# Patient Record
Sex: Male | Born: 1950 | Race: Black or African American | Hispanic: No | Marital: Married | Smoking: Current every day smoker
Health system: Southern US, Community
[De-identification: ages and names within clinical notes are randomized; demographics above are authoritative.]

---

## 2005-01-22 ENCOUNTER — Emergency Department: Payer: Self-pay | Admitting: Emergency Medicine

## 2012-05-04 ENCOUNTER — Inpatient Hospital Stay: Payer: Self-pay | Admitting: Surgery

## 2012-05-04 LAB — COMPREHENSIVE METABOLIC PANEL
Anion Gap: 2 — ABNORMAL LOW (ref 7–16)
BUN: 23 mg/dL — ABNORMAL HIGH (ref 7–18)
Calcium, Total: 9.1 mg/dL (ref 8.5–10.1)
Co2: 31 mmol/L (ref 21–32)
EGFR (African American): >60
Glucose: 134 mg/dL — ABNORMAL HIGH (ref 65–99)
SGOT(AST): 31 U/L (ref 15–37)
SGPT (ALT): 25 U/L (ref 12–78)
Total Protein: 7.8 g/dL (ref 6.4–8.2)

## 2012-05-04 LAB — URINALYSIS, COMPLETE
Bilirubin,UR: NEGATIVE
Ketone: NEGATIVE
RBC,UR: 2 /HPF (ref 0–5)
WBC UR: <1 /HPF (ref 0–5)

## 2012-05-04 LAB — CBC
HCT: 43.3 % (ref 40.0–52.0)
MCH: 27.4 pg (ref 26.0–34.0)
MCV: 84 fL (ref 80–100)
Platelet: 259 10*3/uL (ref 150–440)
RDW: 13.3 % (ref 11.5–14.5)

## 2012-05-04 LAB — TROPONIN I: Troponin-I: <0.02 ng/mL

## 2012-05-04 LAB — LIPASE, BLOOD: Lipase: 128 U/L (ref 73–393)

## 2012-05-04 LAB — CK TOTAL AND CKMB (NOT AT ARMC)
CK, Total: 350 U/L — ABNORMAL HIGH (ref 35–232)
CK-MB: 4 ng/mL — ABNORMAL HIGH (ref 0.5–3.6)

## 2012-05-08 LAB — PATHOLOGY REPORT

## 2014-05-09 NOTE — Discharge Summary (Signed)
PATIENT NAME:  Nicholas Werner, Nicholas Werner MR#:  [id number] DATE OF BIRTH:  08/08/1950  DATE OF ADMISSION:  05/04/2012  DATE OF DISCHARGE:  05/05/2012  FINAL DIAGNOSES:  1.  Acute calculus cholecystitis.  2.  Hypertension.  3.  Tobacco abuse and dependence.   HOSPITAL COURSE SUMMARY:  The patient was admitted with abdominal pain, taken to the operating room on the 18th of April, at which point a laparoscopic cholecystectomy with cholangiography was performed. Normal cholangiogram was found. The patient had stones lodged within the gallbladder neck and an acute on chronic cholecystitis-appearing picture. Postoperatively, he did well. Jackson-Pratt drain was left in place. This remained nonbilious. The patient on postoperative day #1 was tolerating a regular diet. No bile was seen in the drain. Hemodynamically stable with adequate pain control and was discharged home in stable condition on postoperative day #1.   DISCHARGE MEDICATIONS:  Hydrochlorothiazide 12.5 mg by mouth once a day, amlodipine 10 mg by mouth once a day, lisinopril 40 mg by mouth once a day, omeprazole 20 mg by mouth delayed release capsule once a day, aspirin 81 mg by mouth once a day, acetaminophen/hydrocodone 5/325, 1 tab every 6 hours as needed for pain.   DISCHARGE FOLLOWUP:  Follow up April 24th with Dr. Egbert GaribaldiBird in the [place] office location. The patient may shower and remove dressings in 48 hours. Call with any questions or concerns of abdominal pain, fever or jaundice.   ____________________________ Redge GainerMark A. Egbert GaribaldiBird, MD mab:dmm D: 05/15/2012 22:14:55 ET T: 05/15/2012 23:05:44 ET JOB#: [id number]  cc: Loraine LericheMark A. Egbert GaribaldiBird, MD, <Dictator> Raynald KempMARK A Floyd Lusignan MD ELECTRONICALLY SIGNED 05/16/2012 7:56

## 2014-05-09 NOTE — H&P (Signed)
Subjective/Chief Complaint RUQ pain   History of Present Illness acute onset last night, ruq pain nausea, no emesis no f/c nml BM no jaundice has known about gallstones for years   Past History HTN, tob abuse PSH none   Past Medical Health Hypertension, Smoking   Past Med/Surgical Hx:  Hypertension:   ALLERGIES:  No Known Allergies:   HOME MEDICATIONS: Medication Instructions Status  hydrochlorothiazide 12.5 mg oral capsule 1 cap(s) orally once a day Active  amLODIPine 10 mg oral tablet 1 tab(s) orally once a day Active  lisinopril 40 mg oral tablet 1 tab(s) orally once a day Active  omeprazole 20 mg oral delayed release capsule 1 cap(s) orally once a day Active  aspirin 81 mg oral tablet 1 tab(s) orally once a day Active   Family and Social History:  Family History Non-Contributory   Social History positive  tobacco, positive  tobacco (Current within 1 year), negative ETOH   + Tobacco Current (within 1 year)   Place of Living Home   Review of Systems:  Fever/Chills No   Cough No   Abdominal Pain Yes   Diarrhea No   Constipation No   Nausea/Vomiting Yes   SOB/DOE No   Chest Pain No   Dysuria No   Tolerating Diet Nauseated   Physical Exam:  GEN no acute distress, uncomfortable   HEENT pink conjunctivae   NECK supple   RESP normal resp effort  no use of accessory muscles  rhonchi   CARD regular rate   ABD positive tenderness  palpable gallbladder ruq, pos Murphy's sign   LYMPH negative neck   EXTR negative edema   SKIN normal to palpation   PSYCH alert, A+O to time, place, person, good insight   Lab Results: Hepatic:  18-Apr-14 01:46   Bilirubin, Total 0.3  Alkaline Phosphatase 114  SGPT (ALT) 25  SGOT (AST) 31  Total Protein, Serum 7.8  Albumin, Serum 3.7  Routine Chem:  18-Apr-14 01:46   Glucose, Serum  134  BUN  23  Creatinine (comp) 1.16  Sodium, Serum 138  Potassium, Serum 4.0  Chloride, Serum 105  CO2, Serum 31   Calcium (Total), Serum 9.1  Osmolality (calc) 281  eGFR (African American) >60  eGFR (Non-African American) >60 (eGFR values <82m/min/1.73 m2 may be an indication of chronic kidney disease (CKD). Calculated eGFR is useful in patients with stable renal function. The eGFR calculation will not be reliable in acutely ill patients when serum creatinine is changing rapidly. It is not useful in  patients on dialysis. The eGFR calculation may not be applicable to patients at the low and high extremes of body sizes, pregnant women, and vegetarians.)  Anion Gap  2  Lipase 128 (Result(s) reported on 04 May 2012 at 02:07AM.)  Cardiac:  18-Apr-14 01:46   Troponin I < 0.02 (0.00-0.05 0.05 ng/mL or less: NEGATIVE  Repeat testing in 3-6 hrs  if clinically indicated. >0.05 ng/mL: POTENTIAL  MYOCARDIAL INJURY. Repeat  testing in 3-6 hrs if  clinically indicated. NOTE: An increase or decrease  of 30% or more on serial  testing suggests a  clinically important change)  CK, Total  350  CPK-MB, Serum  4.0 (Result(s) reported on 04 May 2012 at 03:45AM.)  Routine UA:  18-Apr-14 01:46   Color (UA) Straw  Clarity (UA) Clear  Glucose (UA) Negative  Bilirubin (UA) Negative  Ketones (UA) Negative  Specific Gravity (UA) 1.013  Blood (UA) Negative  pH (UA) 9.0  Protein (  UA) 30 mg/dL  Nitrite (UA) Negative  Leukocyte Esterase (UA) Negative (Result(s) reported on 04 May 2012 at 02:56AM.)  RBC (UA) 2 /HPF  WBC (UA) <1 /HPF  Bacteria (UA) NONE SEEN  Epithelial Cells (UA) <1 /HPF (Result(s) reported on 04 May 2012 at 02:56AM.)  Routine Hem:  18-Apr-14 01:46   WBC (CBC) 8.1  RBC (CBC) 5.15  Hemoglobin (CBC) 14.1  Hematocrit (CBC) 43.3  Platelet Count (CBC) 259 (Result(s) reported on 04 May 2012 at 02:04AM.)  MCV 84  MCH 27.4  MCHC 32.6  RDW 13.3    Assessment/Admission Diagnosis acute cholecystitis admit hydrate lap chole by Dr Marina Gravel, risks options rev'd agrees with plan   Electronic  Signatures: Florene Glen (MD)  (Signed 18-Apr-14 06:21)  Authored: CHIEF COMPLAINT and HISTORY, PAST MEDICAL/SURGIAL HISTORY, ALLERGIES, HOME MEDICATIONS, FAMILY AND SOCIAL HISTORY, REVIEW OF SYSTEMS, PHYSICAL EXAM, LABS, ASSESSMENT AND PLAN   Last Updated: 18-Apr-14 06:21 by Florene Glen (MD)

## 2014-05-09 NOTE — H&P (Signed)
PATIENT NAME:  Nicholas Werner, Nicholas Werner MR#:  [id number] DATE OF BIRTH:  11/10/1950  DATE OF ADMISSION:  05/04/2012  CHIEF COMPLAINT:  Right upper quadrant pain.   HISTORY OF PRESENT ILLNESS:  This is a 64 year old black male patient with a history of the acute onset of right upper quadrant pain.  It was following a fatty meal.  He is nauseated, but has not had any emesis.  He has had episodes like this before and has in fact known about his gallstones for years, but this is a recent event and he has not had this happen for a long time.  He denies melena or hematochezia.  No jaundice or acholic stools.  No fevers or chills.   PAST MEDICAL HISTORY:  Hypertension and tobacco abuse.   PAST SURGICAL HISTORY:  None.   ALLERGIES:  None.   MEDICATIONS:  Multiple, see chart and reconciliation.   FAMILY HISTORY:  Noncontributory.   SOCIAL HISTORY:  The patient smokes tobacco about 1/2 pack of cigarettes per day.  Works at [employer] in [employer].  Does not drink alcohol.   REVIEW OF SYSTEMS:   A 10 system review is performed and negative with the exception of that mentioned in the history of present illness.   PHYSICAL EXAMINATION: GENERAL:  Healthy-appearing black male patient, appears uncomfortable.  VITAL SIGNS:  Temp of 98, pulse of 84, respirations 16, blood pressure 182/91.  Pain scale of 10, 97% room air sat.  HEENT:  Shows no scleral icterus.  NECK:  No palpable neck nodes.  CHEST:  Clear to auscultation except some rhonchi especially on the right side.  CARDIAC:  Regular rate and rhythm.  ABDOMEN:  Soft.  There is tenderness in the right upper quadrant and the gallbladder is palpable as a mass in the right upper quadrant with a positive Murphy's sign.  EXTREMITIES:  Without edema.  NEUROLOGIC:  Grossly intact.  INTEGUMENT:  Shows no jaundice.   LABORATORY AND RADIOLOGICAL DATA:  An ultrasound report was relayed to me as a wet reading by Dr. Manson PasseyBrown suggesting acute cholecystitis.     Laboratory values demonstrate normal LFTs, normal white blood cell count, and an H and H of 14 and 43 with a platelet count of 259.   ASSESSMENT:  This is a patient with acute cholecystitis.   PLAN:  Is to admit the patient to the hospital, hydrate, start antibiotics and plan laparoscopic cholecystectomy for relief of his symptoms later today or tomorrow by Dr. Egbert GaribaldiBird and at Dr. Molinda BailiffBird's discretion.    The procedure and the rationale were discussed with he and his wife.  The options of observation reviewed and the risks of bleeding, infection, failure to resolve all of his symptoms, conversion to an open procedure, bile duct damage, bile duct leak, bowel injury, any of which could require further surgery and/or ERCP, stent, or papillotomy were all reviewed with them.  They understood and agreed to proceed.     ____________________________ Adah Salvageichard Werner. Excell Seltzerooper, MD rec:ea D: 05/04/2012 06:24:27 ET T: 05/04/2012 07:06:48 ET JOB#: [id number]  cc: Adah Salvageichard Werner. Excell Seltzerooper, MD, <Dictator> Lattie HawICHARD Werner Rashana Andrew MD ELECTRONICALLY SIGNED 05/09/2012 13:03

## 2014-05-09 NOTE — Op Note (Signed)
PATIENT NAME:  Nicholas Werner, [name] MR#:  [id number] DATE OF BIRTH:  [birth year]  DATE OF PROCEDURE:  05/04/2012  PREOPERATIVE DIAGNOSIS:  Acute calculus cholecystitis.   POSTOPERATIVE DIAGNOSIS:  Acute calculus cholecystitis.  PROCEDURE PERFORMED:  Laparoscopic cholecystectomy with intraoperative cholangiography.   SURGEON:  Lajuan Kovaleski A. Egbert GaribaldiBird, MD   ASSISTANTS:  None.   ANESTHESIA:  General endotracheal.   FINDINGS: 1.  Stones impacted in gallbladder neck.  2.  Normal cholangiogram.  3.  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   SPECIMENS:  Gallbladder with contents to Pathology.   DRAINS:  Jackson-Pratt in Morison's pouch.   ESTIMATED BLOOD LOSS:  75 mL.   DESCRIPTION OF PROCEDURE:  Informed consent, supine position, general endotracheal anesthesia, the left arm padded and tucked at his side. The abdomen prepped and draped of hair and ChloraPrep. Time-out was observed. A 12-mm blunt Hassan trocar was placed through an open technique through an infraumbilical skin incision. Pneumoperitoneum was established. A 5-mm Bladeless trocar was placed in the epigastric region. Two 5-mm ports were then placed a right subcostal margin. The gallbladder was tensely distended consistent with acute cholecystitis. It was aspirated of approximately 50 mL of thick bile. This allowed placement of a grasper on the fundus. The gallbladder was elevated towards the right shoulder. A large amount of attachments that appeared to be chronic and acute were swept down inferiorly off the gallbladder body with blunt technique and application of point cautery at bleeding points. There appeared to be stones lodged within the gallbladder neck. These were able to be milked up back into the gallbladder such that lateral traction could be applied to Hartmann's pouch. The dissection of the hepatoduodenal ligament demonstrated an anteriorly located cystic artery which was triply clipped on the portal side, singly clipped on the gallbladder side and  divided. The pericystic lymph node was identified as well.   The cystic duct was thickened. It was involved in a chronic inflammatory rind which was taken down with blunt technique. The course of the common bile duct was identified. In the course of doing this, a small hole was made in the gallbladder. This was controlled with suction. Lateral and medial attachments of the gallbladder above this area on the gallbladder fossa were taken down with hook electrocautery such that a critical view of safety cholecystectomy was achieved. A Reddick catheter was brought onto the field, placed into the small defect and balloon occlusion of the cystic duct was achieved. Dynamic fluoroscopy was used to achieve cholangiography which appeared to be normal with proper identification of the cystic duct. There was filling of the duodenum. The bile duct appeared to be dilated but patent. No obvious filling defects were encountered. The bifurcation the right and left systems were identified. Due to the thickened nature of the duct and the hole ready in it, I elected to transect this with an endoscopic GIA stapler with blue load application which was achieved by exchanging the 5-mm trocar site to a 12 and transecting the cystic duct with the stapler. The gallbladder was then retrieved off the gallbladder fossa utilizing hook electrocautery, placed into an EndoCatch device and retrieved. The right upper quadrant was irrigated with several liters of normal saline during the case and aspirated dry. Hemostasis on the operative field appeared to be adequate. A 19-mm Blake drain was directed into this space and exited the lowermost right upper quadrant port site. The drain site was secured with silk suture. The ports were then removed under direct visualization. Hemostasis appeared to be adequate  on the operative field. The infraumbilical fascial defect was closed utilizing a total of 3 simple and figure-of-eight #0 Vicryl sutures in  vertical orientation. A total of 30 mL of 0.25% plain Marcaine was infiltrated along all skin and fascial incisions prior to closure. Benzoin, Steri-Strips, Telfa and Tegaderm were applied, and the patient was then subsequently extubated and taken to the recovery room in stable and satisfactory condition by anesthesia services.    ____________________________ Redge Gainer Egbert Garibaldi, MD mab:jm D: 05/04/2012 17:53:21 ET T: 05/05/2012 09:58:33 ET JOB#: [id number]  cc: Loraine Leriche A. Egbert Garibaldi, MD, <Dictator> Raynald Kemp MD ELECTRONICALLY SIGNED 05/11/2012 19:44

## 2014-08-25 IMAGING — US ABDOMEN ULTRASOUND LIMITED
1 series · 14 of 25 positions shown · non-contrast
Comparison: none

REASON FOR EXAM: RUQ pain and nausea
COMMENTS:   Body Site: GB and Fossa, CBD, Head of Pancreas

PROCEDURE:     US  - US ABDOMEN LIMITED SURVEY  - May 04, 2012  [DATE]
RESULT:     Comparison: None.
TECHNIQUE: Multiple grayscale and color Doppler images were obtained of the
right upper quadrant.

[Series 1: abdomen ultrasound limited · 0.31mm/px · 14 of 52 slices shown]
[im 1/52]
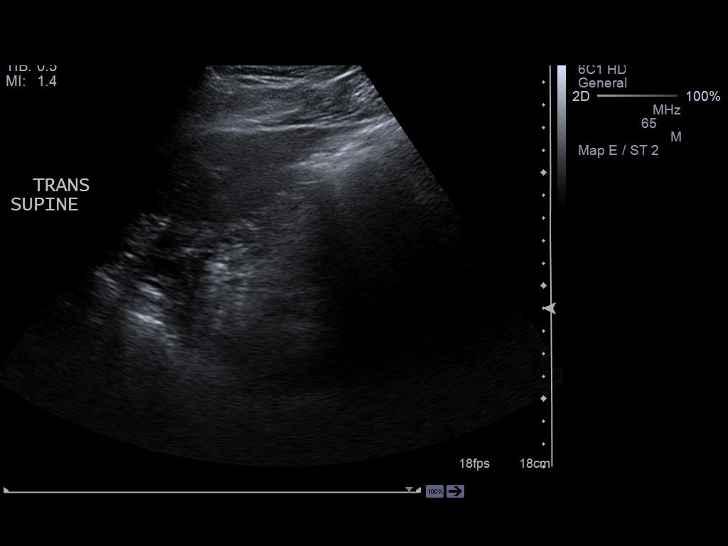
[im 5/52]
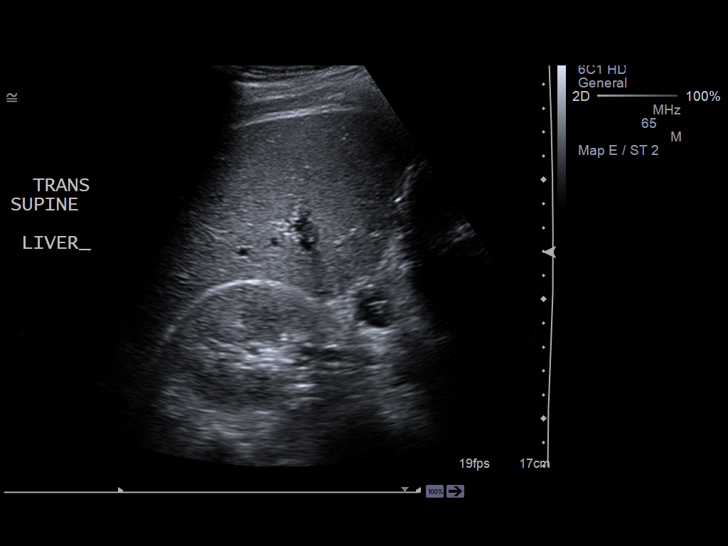
[im 9/52]
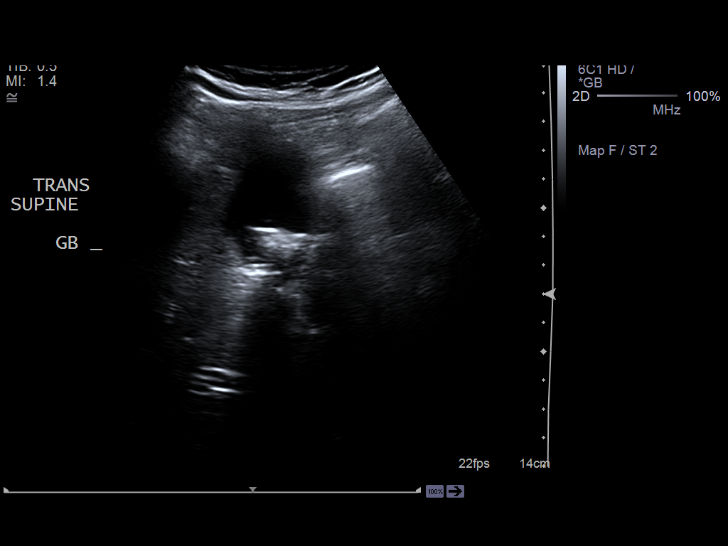
[im 13/52]
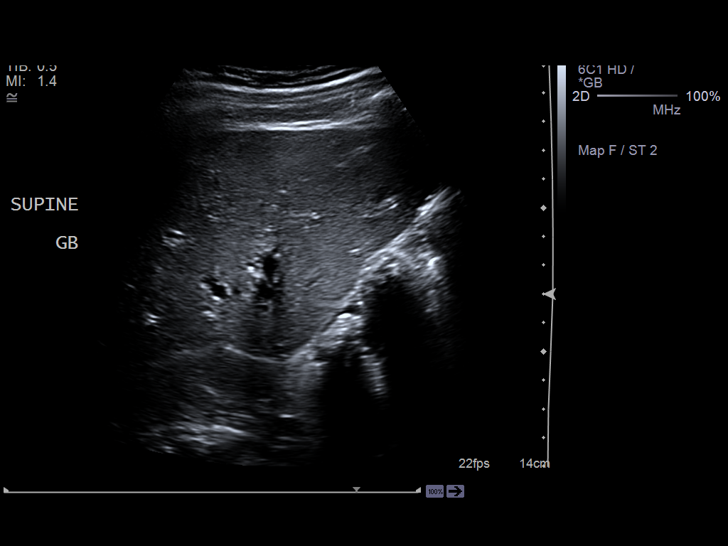
[im 18/52]
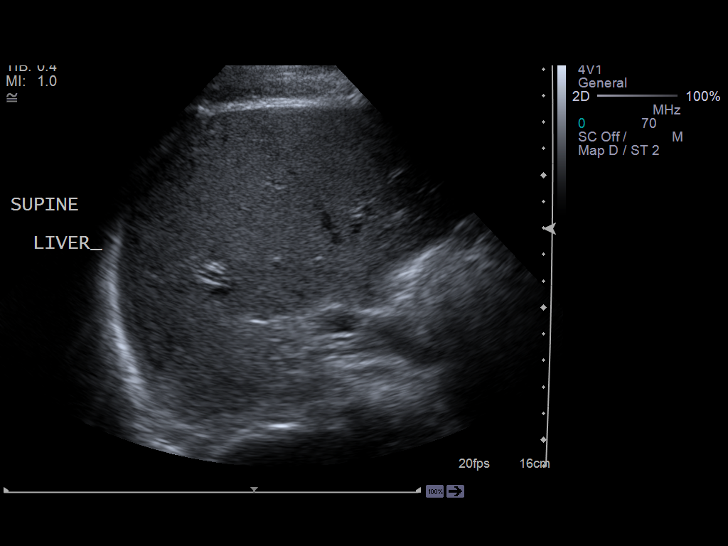
[im 20/52]
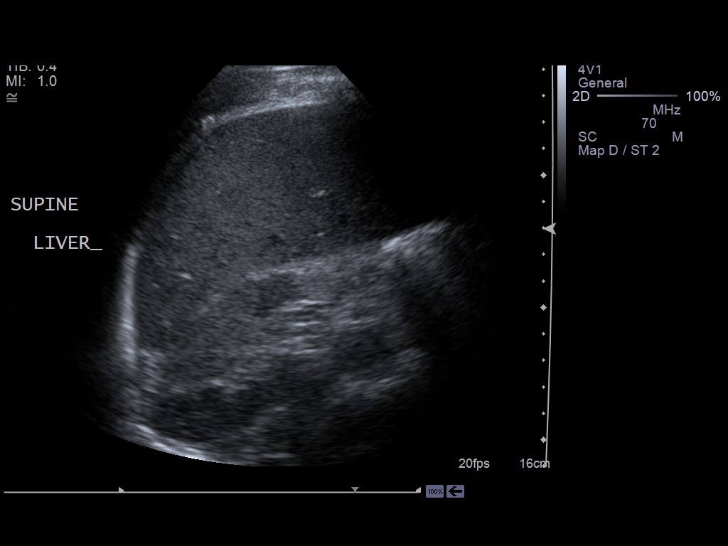
[im 24/52]
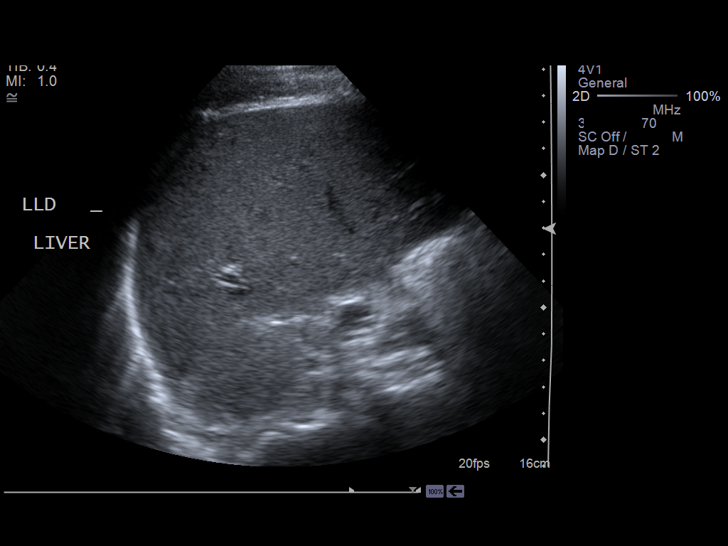
[im 28/52]
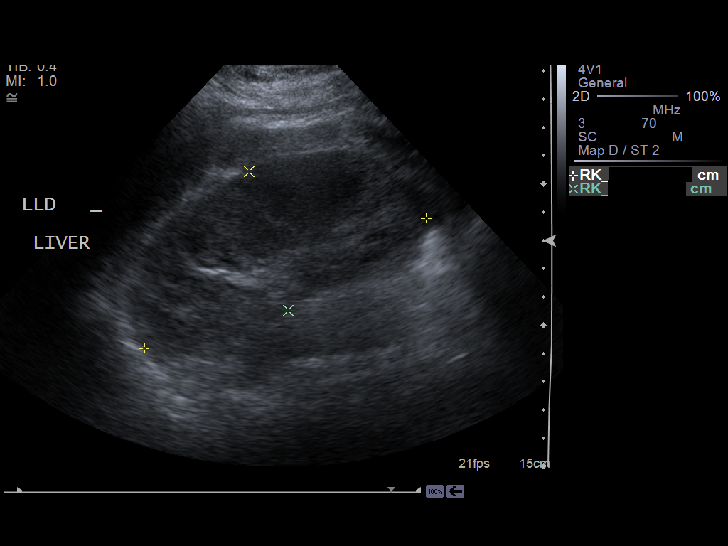
[im 32/52]
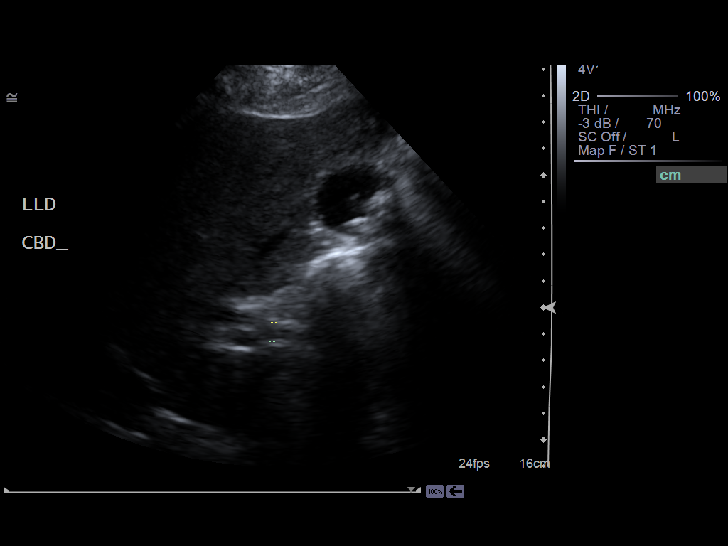
[im 35/52]
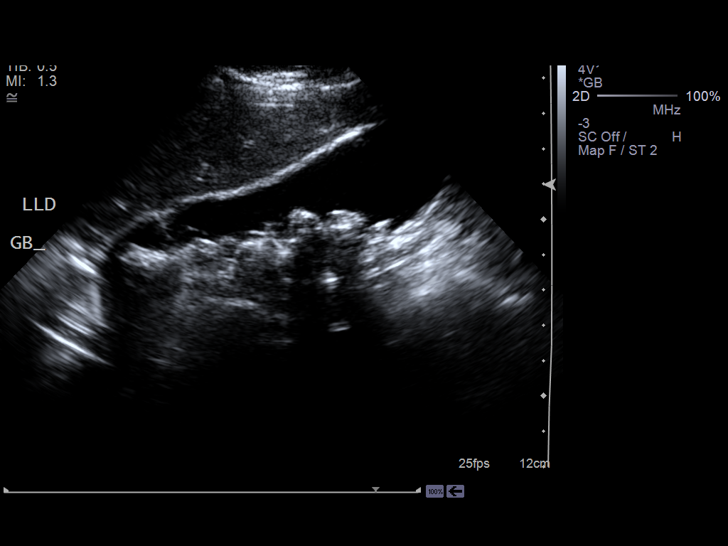
[im 39/52]
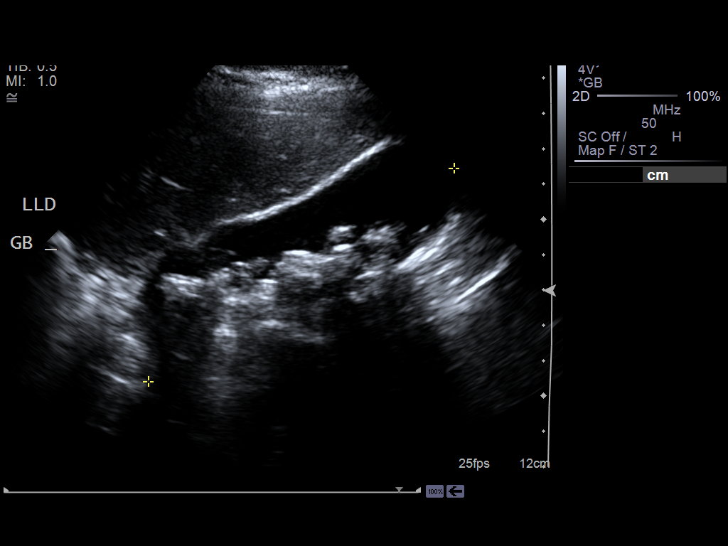
[im 43/52]
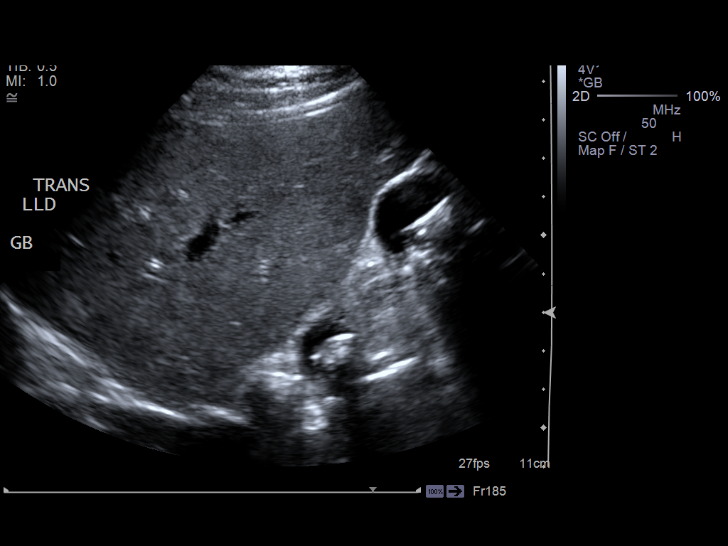
[im 47/52]
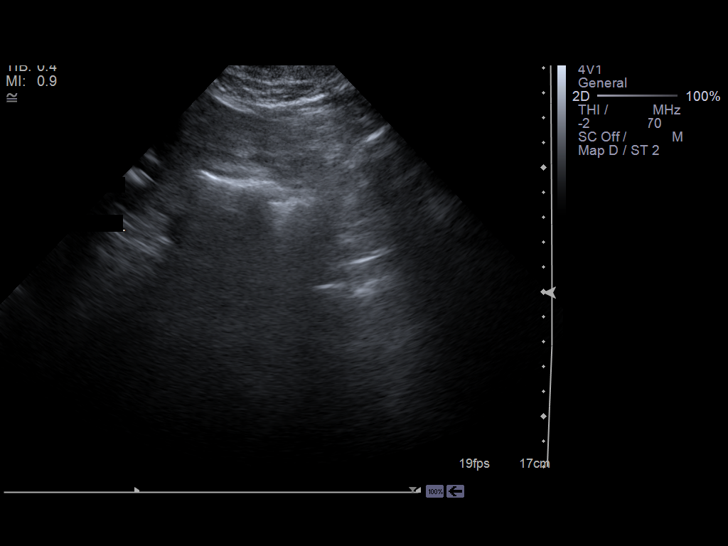
[im 52/52]
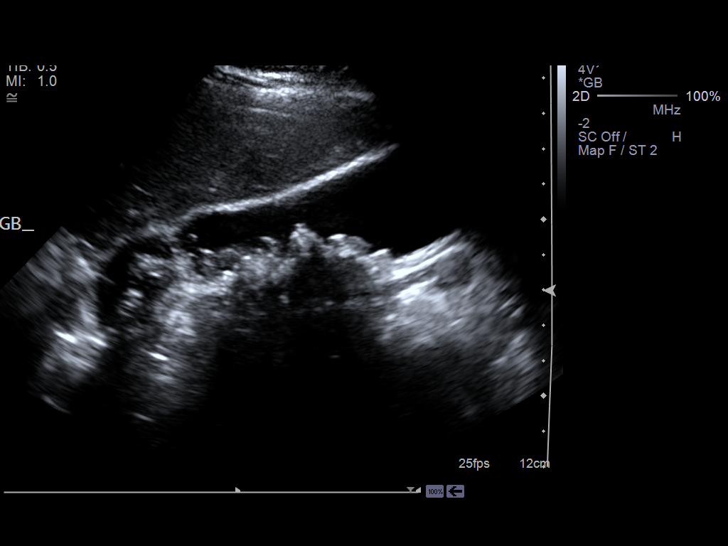

[14 of 25 positions shown; findings below may reference images not displayed]

FINDINGS: The pancreas was obscured by overlying bowel gas. The visualized liver is
unremarkable. The main portal vein is patent. The common bile duct is
borderline enlarged, measuring 7 mm in diameter. There are multiple stones
in the gallbladder. There is mild gallbladder wall thickening, measuring 3-4
mm in thickness. The sonographic Murphy sign was positive. By report from
the technologist, some of the stones in the gallbladder neck were not mobile.
IMPRESSION: 1. Cholelithiasis, with findings concerning for acute cholecystitis.
2. The common bile duct is borderline dilated. Etiology for this dilatation
is not identified on this study. Further evaluation could be provided with
ERCP or M.R.C.P., as indicated.

## 2018-01-29 ENCOUNTER — Ambulatory Visit: Payer: Self-pay | Admitting: Family Medicine

## 2018-08-17 ENCOUNTER — Other Ambulatory Visit: Payer: Self-pay

## 2019-01-15 DIAGNOSIS — U071 COVID-19: Secondary | ICD-10-CM | POA: Diagnosis not present

## 2019-01-25 ENCOUNTER — Ambulatory Visit: Payer: PPO | Attending: Internal Medicine

## 2019-01-25 DIAGNOSIS — Z20822 Contact with and (suspected) exposure to covid-19: Secondary | ICD-10-CM | POA: Diagnosis not present

## 2019-01-27 LAB — NOVEL CORONAVIRUS, NAA: SARS-CoV-2, NAA: NOT DETECTED

## 2019-05-11 DIAGNOSIS — I10 Essential (primary) hypertension: Secondary | ICD-10-CM | POA: Diagnosis not present

## 2019-05-11 DIAGNOSIS — Z1389 Encounter for screening for other disorder: Secondary | ICD-10-CM | POA: Diagnosis not present

## 2019-05-11 DIAGNOSIS — E119 Type 2 diabetes mellitus without complications: Secondary | ICD-10-CM | POA: Diagnosis not present

## 2019-05-11 DIAGNOSIS — Z125 Encounter for screening for malignant neoplasm of prostate: Secondary | ICD-10-CM | POA: Diagnosis not present

## 2019-05-11 DIAGNOSIS — F17200 Nicotine dependence, unspecified, uncomplicated: Secondary | ICD-10-CM | POA: Diagnosis not present

## 2019-05-14 DIAGNOSIS — F17200 Nicotine dependence, unspecified, uncomplicated: Secondary | ICD-10-CM | POA: Diagnosis not present

## 2019-05-14 DIAGNOSIS — Z125 Encounter for screening for malignant neoplasm of prostate: Secondary | ICD-10-CM | POA: Diagnosis not present

## 2019-05-14 DIAGNOSIS — I10 Essential (primary) hypertension: Secondary | ICD-10-CM | POA: Diagnosis not present

## 2019-05-14 DIAGNOSIS — Z1389 Encounter for screening for other disorder: Secondary | ICD-10-CM | POA: Diagnosis not present

## 2019-05-14 DIAGNOSIS — E119 Type 2 diabetes mellitus without complications: Secondary | ICD-10-CM | POA: Diagnosis not present

## 2019-05-22 ENCOUNTER — Telehealth: Payer: Self-pay | Admitting: *Deleted

## 2019-05-22 NOTE — Telephone Encounter (Signed)
Received referral for lung screening scan. Contacted patient and obtained smoking history of 1/2 pack per day x 50 years. Notified patient that Medicare is not covering for new recommendations by USPSTF yet but that he can have screening scan as self pay if desired. Patient would like to wait until scan is covered by insurance.

## 2019-07-03 ENCOUNTER — Telehealth: Payer: Self-pay | Admitting: General Practice

## 2019-07-03 NOTE — Telephone Encounter (Signed)
Patient appears on [facility], but has never been seen here.  I left a message asking him to call and update the information.

## 2019-07-08 ENCOUNTER — Telehealth: Payer: Self-pay | Admitting: General Practice

## 2019-07-08 NOTE — Telephone Encounter (Signed)
2nd attempt to reach patient to confirm PCP.  Left msg

## 2019-07-10 ENCOUNTER — Telehealth: Payer: Self-pay | Admitting: General Practice

## 2019-07-10 NOTE — Telephone Encounter (Signed)
3rd attempt. No answer.

## 2019-10-01 DIAGNOSIS — L720 Epidermal cyst: Secondary | ICD-10-CM | POA: Diagnosis not present

## 2021-07-09 ENCOUNTER — Telehealth: Payer: Self-pay

## 2021-07-09 NOTE — Telephone Encounter (Signed)
Added to account.

## 2021-10-06 NOTE — Progress Notes (Signed)
New patient visit   Patient: Nicholas Werner   DOB: 10-12-1950   71 y.o. Male  MRN: [id number] Visit Date: 10/08/2021  Today's healthcare provider: Jacky Kindle, FNP  Patient presents for new patient visit to establish care.  Introduced to Publishing rights manager role and practice setting.  All questions answered.  Discussed provider/patient relationship and expectations.   I,Tiffany J Bragg,acting as a scribe for Jacky Kindle, FNP.,have documented all relevant documentation on the behalf of Jacky Kindle, FNP,as directed by  Jacky Kindle, FNP while in the presence of Jacky Kindle, FNP.   Chief Complaint  Patient presents with   Establish Care   Hypertension   Subjective    Nicholas Werner is a 71 y.o. male who presents today as a new patient to establish care.  HPI    History reviewed. No pertinent past medical history.  No family status information on file.   History reviewed. No pertinent family history. Social History   Socioeconomic History   Marital status: Married    Spouse name: Not on file   Number of children: Not on file   Years of education: Not on file   Highest education level: Not on file  Occupational History   Not on file  Tobacco Use   Smoking status: Every Day    Packs/day: 0.50    Types: Cigarettes    Start date: 01/17/1998   Smokeless tobacco: Never  Substance and Sexual Activity   Alcohol use: Not Currently   Drug use: Not Currently   Sexual activity: Not on file  Other Topics Concern   Not on file  Social History Narrative   Not on file   Social Determinants of Health   Financial Resource Strain: Not on file  Food Insecurity: Not on file  Transportation Needs: Not on file  Physical Activity: Not on file  Stress: Not on file  Social Connections: Not on file   Outpatient Medications Prior to Visit  Medication Sig   amLODipine (NORVASC) 10 MG tablet Take 10 mg by mouth daily.   hydrochlorothiazide (HYDRODIURIL) 25 MG tablet Take 25  mg by mouth daily.   lisinopril (ZESTRIL) 40 MG tablet Take 40 mg by mouth daily.   omeprazole (PRILOSEC) 20 MG capsule Take 20 mg by mouth daily.   No facility-administered medications prior to visit.   Not on File   There is no immunization history on file for this patient.  Health Maintenance  Topic Date Due   Hepatitis C Screening  Never done   COLONOSCOPY (Pts 45-20yrs Insurance coverage will need to be confirmed)  Never done   COVID-19 Vaccine (1) 10/24/2021 (Originally 05/23/1951)   Zoster Vaccines- Shingrix (1 of 2) 01/07/2022 (Originally 11/23/2001)   INFLUENZA VACCINE  04/17/2022 (Originally 08/17/2021)   Pneumonia Vaccine 71+ Years old (1 - PCV) 10/09/2022 (Originally 11/23/1956)   TETANUS/TDAP  09/29/2030   HPV VACCINES  Aged Out    Patient Care Team: Jacky Kindle, FNP as PCP - General (Family Medicine)  Review of Systems  HENT:  Positive for dental problem.   Respiratory:  Positive for cough.   Genitourinary:  Positive for frequency.  Musculoskeletal:  Positive for myalgias.  Neurological:  Positive for dizziness.    Objective    BP 125/78 (BP Location: Right Arm, Patient Position: Sitting, Cuff Size: Normal)   Pulse 87   Resp 16   Ht 5\' 10"  (1.778 m)   Wt 175 lb (79.4 kg)  SpO2 100%   BMI 25.11 kg/m   Physical Exam Vitals and nursing note reviewed.  Constitutional:      Appearance: Normal appearance. He is overweight.  HENT:     Head: Normocephalic and atraumatic.  Eyes:     Pupils: Pupils are equal, round, and reactive to light.  Cardiovascular:     Rate and Rhythm: Normal rate and regular rhythm.     Pulses: Normal pulses.     Heart sounds: Normal heart sounds.  Pulmonary:     Effort: Pulmonary effort is normal.     Breath sounds: Normal breath sounds.  Musculoskeletal:        General: Normal range of motion.     Cervical back: Normal range of motion.  Skin:    General: Skin is warm and dry.     Capillary Refill: Capillary refill takes less  than 2 seconds.  Neurological:     General: No focal deficit present.     Mental Status: He is alert and oriented to person, place, and time. Mental status is at baseline.  Psychiatric:        Mood and Affect: Mood normal.        Behavior: Behavior normal.        Thought Content: Thought content normal.        Judgment: Judgment normal.     Depression Screen    10/08/2021    2:12 PM 10/08/2021    2:04 PM  PHQ 2/9 Scores  PHQ - 2 Score 0 0  PHQ- 9 Score 1 1   No results found for any visits on 10/08/21.  Assessment & Plan      Problem List Items Addressed This Visit       Cardiovascular and Mediastinum   Primary hypertension - Primary    Chronic, stable; at goal <130/<80 [facility] Controlled on norvasc 10 mg, HCTZ 25 mg in addition to lisinopril 40 mg       Relevant Medications   lisinopril (ZESTRIL) 40 MG tablet   amLODipine (NORVASC) 10 MG tablet   hydrochlorothiazide (HYDRODIURIL) 25 MG tablet   Other Relevant Orders   Comprehensive metabolic panel   CBC with Differential/Platelet   Lipid panel     Digestive   Gastroesophageal reflux disease without esophagitis    Chronic, stable Currently maintained on prilosec 20 mg      Relevant Medications   omeprazole (PRILOSEC) 20 MG capsule     Other   Elevated cholesterol    Chronic; controlled with diet and exercise Currently smokes Check LP recommend diet low in saturated fat and regular exercise - 30 min at least 5 times per week       Relevant Medications   lisinopril (ZESTRIL) 40 MG tablet   amLODipine (NORVASC) 10 MG tablet   hydrochlorothiazide (HYDRODIURIL) 25 MG tablet   Other Relevant Orders   Lipid panel   Encounter for hepatitis C screening test for low risk patient    Low risk screen Treatable, and curable. If left untreated Hep C can lead to cirrhosis and liver failure. Encourage routine testing; recommend repeat testing if risk factors change.       Relevant Orders    Hepatitis C Antibody   History of diabetes mellitus, type II    Seen in labs >10 years ago; repeat A1c Continue to recommend balanced, lower carb meals. Smaller meal size, adding snacks. Choosing water as drink of choice and increasing purposeful exercise. Not on any Rx  medication- controlled with diet/exercise       Relevant Orders   Hemoglobin A1c   Screening for colon cancer    Reports remote hx of colon polyps; due for repeat colon cancer screening       Relevant Orders   Ambulatory referral to Gastroenterology   Screening for prostate cancer    Denies LUTS; defer DRE- recommend PSA for screening       Relevant Orders   PSA   Screening for thyroid disorder    Recommend Screening thyroid to establish baseline       Relevant Orders   TSH + free T4   Tobacco use disorder, moderate, dependence    Chronic, stable Reports <10 cigs/day; has been smoking for 20 years 3-5 minute discuss regarding risks of tobacco/nicotine use and recommendations on ways to reduce use and work towards cessation of use of tobacco/nicotine products. Encouraged to use 1-800-QUIT-NOW. Does not wish to quit at this time         Return in about 6 months (around 04/08/2022) for chonic disease management.     Leilani Merl, FNP, have reviewed all documentation for this visit. The documentation on 10/08/21 for the exam, diagnosis, procedures, and orders are all accurate and complete.    Jacky Kindle, FNP  [facility] [phone] (phone) [phone] (fax)  [facility]

## 2021-10-08 ENCOUNTER — Encounter: Payer: Self-pay | Admitting: Family Medicine

## 2021-10-08 ENCOUNTER — Ambulatory Visit (INDEPENDENT_AMBULATORY_CARE_PROVIDER_SITE_OTHER): Payer: Medicare Other | Admitting: Family Medicine

## 2021-10-08 VITALS — BP 125/78 | HR 87 | Resp 16 | Ht 70.0 in | Wt 175.0 lb

## 2021-10-08 DIAGNOSIS — Z1329 Encounter for screening for other suspected endocrine disorder: Secondary | ICD-10-CM

## 2021-10-08 DIAGNOSIS — I10 Essential (primary) hypertension: Secondary | ICD-10-CM

## 2021-10-08 DIAGNOSIS — Z1159 Encounter for screening for other viral diseases: Secondary | ICD-10-CM

## 2021-10-08 DIAGNOSIS — Z8639 Personal history of other endocrine, nutritional and metabolic disease: Secondary | ICD-10-CM

## 2021-10-08 DIAGNOSIS — F17200 Nicotine dependence, unspecified, uncomplicated: Secondary | ICD-10-CM

## 2021-10-08 DIAGNOSIS — E7800 Pure hypercholesterolemia, unspecified: Secondary | ICD-10-CM | POA: Diagnosis not present

## 2021-10-08 DIAGNOSIS — Z1211 Encounter for screening for malignant neoplasm of colon: Secondary | ICD-10-CM | POA: Diagnosis not present

## 2021-10-08 DIAGNOSIS — Z125 Encounter for screening for malignant neoplasm of prostate: Secondary | ICD-10-CM

## 2021-10-08 DIAGNOSIS — K219 Gastro-esophageal reflux disease without esophagitis: Secondary | ICD-10-CM | POA: Diagnosis not present

## 2021-10-08 NOTE — Assessment & Plan Note (Signed)
Chronic, stable Currently maintained on prilosec 20 mg

## 2021-10-08 NOTE — Assessment & Plan Note (Signed)
Reports remote hx of colon polyps; due for repeat colon cancer screening

## 2021-10-08 NOTE — Assessment & Plan Note (Signed)
Chronic; controlled with diet and exercise Currently smokes Check LP recommend diet low in saturated fat and regular exercise - 30 min at least 5 times per week

## 2021-10-08 NOTE — Assessment & Plan Note (Signed)
Chronic, stable Reports <10 cigs/day; has been smoking for 20 years 3-5 minute discuss regarding risks of tobacco/nicotine use and recommendations on ways to reduce use and work towards cessation of use of tobacco/nicotine products. Encouraged to use 1-800-QUIT-NOW. Does not wish to quit at this time

## 2021-10-08 NOTE — Assessment & Plan Note (Signed)
Seen in labs >10 years ago; repeat A1c Continue to recommend balanced, lower carb meals. Smaller meal size, adding snacks. Choosing water as drink of choice and increasing purposeful exercise. Not on any Rx medication- controlled with diet/exercise

## 2021-10-08 NOTE — Assessment & Plan Note (Signed)
Chronic, stable; at goal <130/<80 [facility] Controlled on norvasc 10 mg, HCTZ 25 mg in addition to lisinopril 40 mg

## 2021-10-08 NOTE — Assessment & Plan Note (Signed)
Recommend Screening thyroid to establish baseline

## 2021-10-08 NOTE — Assessment & Plan Note (Signed)
Low risk screen Treatable, and curable. If left untreated Hep C can lead to cirrhosis and liver failure. Encourage routine testing; recommend repeat testing if risk factors change.  

## 2021-10-08 NOTE — Assessment & Plan Note (Signed)
Denies LUTS; defer DRE- recommend PSA for screening

## 2021-10-09 LAB — CBC WITH DIFFERENTIAL/PLATELET
Basophils Absolute: 0 10*3/uL (ref 0.0–0.2)
Basos: 0 %
EOS (ABSOLUTE): 0.2 10*3/uL (ref 0.0–0.4)
Eos: 4 %
Hematocrit: 42 % (ref 37.5–51.0)
Hemoglobin: 13.5 g/dL (ref 13.0–17.7)
Immature Grans (Abs): 0 10*3/uL (ref 0.0–0.1)
Immature Granulocytes: 0 %
Lymphocytes Absolute: 1.8 10*3/uL (ref 0.7–3.1)
Lymphs: 38 %
MCH: 26.8 pg (ref 26.6–33.0)
MCHC: 32.1 g/dL (ref 31.5–35.7)
MCV: 83 fL (ref 79–97)
Monocytes Absolute: 0.4 10*3/uL (ref 0.1–0.9)
Monocytes: 8 %
Neutrophils Absolute: 2.4 10*3/uL (ref 1.4–7.0)
Neutrophils: 50 %
Platelets: 243 10*3/uL (ref 150–450)
RBC: 5.04 x10E6/uL (ref 4.14–5.80)
RDW: 12.2 % (ref 11.6–15.4)
WBC: 4.8 10*3/uL (ref 3.4–10.8)

## 2021-10-09 LAB — HEMOGLOBIN A1C
Est. average glucose Bld gHb Est-mCnc: 209 mg/dL
Hgb A1c MFr Bld: 8.9 % — ABNORMAL HIGH (ref 4.8–5.6)

## 2021-10-09 LAB — COMPREHENSIVE METABOLIC PANEL
ALT: 14 IU/L (ref 0–44)
AST: 17 IU/L (ref 0–40)
Albumin/Globulin Ratio: 1.5 (ref 1.2–2.2)
Albumin: 4.3 g/dL (ref 3.9–4.9)
Alkaline Phosphatase: 125 IU/L — ABNORMAL HIGH (ref 44–121)
BUN/Creatinine Ratio: 14 (ref 10–24)
BUN: 19 mg/dL (ref 8–27)
Bilirubin Total: 0.3 mg/dL (ref 0.0–1.2)
CO2: 28 mmol/L (ref 20–29)
Calcium: 10 mg/dL (ref 8.6–10.2)
Chloride: 96 mmol/L (ref 96–106)
Creatinine, Ser: 1.32 mg/dL — ABNORMAL HIGH (ref 0.76–1.27)
Globulin, Total: 2.9 g/dL (ref 1.5–4.5)
Glucose: 164 mg/dL — ABNORMAL HIGH (ref 70–99)
Potassium: 4.4 mmol/L (ref 3.5–5.2)
Sodium: 140 mmol/L (ref 134–144)
Total Protein: 7.2 g/dL (ref 6.0–8.5)
eGFR: 58 mL/min/{1.73_m2} — ABNORMAL LOW (ref >59)

## 2021-10-09 LAB — TSH+FREE T4
Free T4: 1.21 ng/dL (ref 0.82–1.77)
TSH: 0.842 u[IU]/mL (ref 0.450–4.500)

## 2021-10-09 LAB — LIPID PANEL
Chol/HDL Ratio: 3.5 ratio (ref 0.0–5.0)
Cholesterol, Total: 191 mg/dL (ref 100–199)
HDL: 55 mg/dL (ref >39)
LDL Chol Calc (NIH): 115 mg/dL — ABNORMAL HIGH (ref 0–99)
Triglycerides: 116 mg/dL (ref 0–149)
VLDL Cholesterol Cal: 21 mg/dL (ref 5–40)

## 2021-10-09 LAB — HEPATITIS C ANTIBODY: Hep C Virus Ab: REACTIVE — AB

## 2021-10-09 LAB — PSA: Prostate Specific Ag, Serum: 1.3 ng/mL (ref 0.0–4.0)

## 2021-10-10 NOTE — Progress Notes (Signed)
Blood work shows -uncontrolled diabetes with A1c of 8.9. Glycemic controlled recommended with A1c <7%. Continue to recommend balanced, lower carb meals. Smaller meal size, adding snacks. Choosing water as drink of choice and increasing purposeful exercise.Recommend starting farxiga 10 mg and glipizide 5 mg BID to assist. Please let us know if you would like to start on this medication regimen. -hep C antibody was positive; recommend genotype to ensure liver health; will need redraw of labs if we are unable to see if it can be added on to sample from last week (CMA please call lab prior to calling pt) -kidney function and liver enzyme are elevated; recommend 64 oz/water per day to ensure kidney health -Cholesterol shows elevated bad/LDL cholesterol. The 10-year ASCVD risk score (Arnett DK, et al., 2019) is: 45.9%   Values used to calculate the score:     Age: 71 years     Sex: Male     Is Non-Hispanic African American: Yes     Diabetic: Yes     Tobacco smoker: Yes     Systolic Blood Pressure: 656 mmHg     Is BP treated: Yes     HDL Cholesterol: 55 mg/dL     Total Cholesterol: 191 mg/dL Heart attack and stroke risk is 46% estimated within the next 10 years which is very high. Recommend starting Crestor 20mg . Please let us know if you would like to start on this medication.  All other labs are stable.  You will be contacted from GI to schedule your 5 year colon cancer screening.  [name], [place] [address] [place], [place] [address] [phone] (phone) [phone] (fax) [place]

## 2021-10-12 ENCOUNTER — Telehealth: Payer: Self-pay | Admitting: Family Medicine

## 2021-10-12 NOTE — Telephone Encounter (Signed)
Medication Refill - Medication: amLODipine (NORVASC) 10 MG tablet,  hydrochlorothiazide (HYDRODIURIL) 25 MG tablet.   lisinopril (ZESTRIL) 40 MG tablet omeprazole (PRILOSEC) 20 MG capsule  Has the patient contacted their pharmacy? Yes.   Pt was seen Fri 22, scripts were prepared after visit but never submitted (Agent: If no, request that the patient contact the pharmacy for the refill. If patient does not wish to contact the pharmacy document the reason why and proceed with request.) (Agent: If yes, when and what did the pharmacy advise?) Call dr   Preferred Pharmacy (with phone number or street name):  [id number] [place], [place] [address]  [place] [address]  Phone: [phone] Fax: [phone]   Has the patient been seen for an appointment in the last year OR does the patient have an upcoming appointment? Yes.   10/08/21  Agent: Please be advised that RX refills may take up to 3 business days. We ask that you follow-up with your pharmacy.

## 2021-10-13 ENCOUNTER — Other Ambulatory Visit: Payer: Self-pay | Admitting: Family Medicine

## 2021-10-13 MED ORDER — GLIPIZIDE 5 MG PO TABS: 5.0000 mg | ORAL_TABLET | Freq: Two times a day (BID) | ORAL | 11 refills | Status: DC

## 2021-10-13 MED ORDER — ROSUVASTATIN CALCIUM 20 MG PO TABS: 20.0000 mg | ORAL_TABLET | Freq: Every day | ORAL | 11 refills | Status: DC

## 2021-10-13 MED ORDER — DAPAGLIFLOZIN PROPANEDIOL 10 MG PO TABS: 10.0000 mg | ORAL_TABLET | Freq: Every day | ORAL | 11 refills | Status: DC

## 2022-01-03 ENCOUNTER — Other Ambulatory Visit: Payer: Self-pay | Admitting: Family Medicine

## 2022-01-03 NOTE — Telephone Encounter (Signed)
Medication Refill - Medication: omeprazole (PRILOSEC) 20 MG capsule   Has the patient contacted their pharmacy? yes (Agent: If no, request that the patient contact the pharmacy for the refill. If patient does not wish to contact the pharmacy document the reason why and proceed with request.) (Agent: If yes, when and what did the pharmacy advise?)contact pcp  Preferred Pharmacy (with phone number or street name): [id number] - [place], [place] [address] Phone: [phone]  Fax: [phone]   Has the patient been seen for an appointment in the last year OR does the patient have an upcoming appointment? yes  Agent: Please be advised that RX refills may take up to 3 business days. We ask that you follow-up with your pharmacy.

## 2022-01-04 NOTE — Telephone Encounter (Signed)
Okay to fill medication?

## 2022-01-04 NOTE — Telephone Encounter (Signed)
Pt also needs refill for hydrochlorothiazide (HYDRODIURIL) 25 MG tablet [[id number]]  amLODipine (NORVASC) 10 MG tablet [[id number]]  Preferred Pharmacy (with phone number or street name): [id number] - [place], [place] - [address]  Pt is out of all meds and needs refill asap / please advise

## 2022-01-04 NOTE — Telephone Encounter (Signed)
Requested medication (s) are due for refill today:   Not sure  Requested medication (s) are on the active medication list:   Yes as historical   Future visit scheduled:   Yes with Merita Norton on 01/14/2022   Last ordered: Historical  Returned because it's listed as historical on her med list.     Requested Prescriptions  Pending Prescriptions Disp Refills   omeprazole (PRILOSEC) 20 MG capsule      Sig: Take 1 capsule (20 mg total) by mouth daily.     Gastroenterology: Proton Pump Inhibitors Passed - 01/03/2022  5:02 PM      Passed - Valid encounter within last 12 months    Recent Outpatient Visits           2 months ago Primary hypertension   [facility] Jacky Kindle, FNP       Future Appointments             In 1 week Jacky Kindle, FNP [facility]

## 2022-01-05 MED ORDER — OMEPRAZOLE 20 MG PO CPDR: 20.0000 mg | DELAYED_RELEASE_CAPSULE | Freq: Every day | ORAL | 0 refills | Status: DC

## 2022-01-13 NOTE — Progress Notes (Signed)
I,Connie R Striblin,acting as a Education administrator for [name], FNP.,have documented all relevant documentation on the behalf of [name], FNP,as directed by  [name], FNP while in the presence of [name], FNP.  Established patient visit  Patient: Nicholas Werner   DOB: [birth year]   71 y.o. Male  MRN: [id number] Visit Date: 01/14/2022  Today's healthcare provider: [name], FNP  Introduced to nurse practitioner role and practice setting.  All questions answered.  Discussed provider/patient relationship and expectations.  Subjective    HPI  Diabetes Mellitus Type II, Follow-up  Lab Results  Component Value Date   HGBA1C 8.9 (H) 10/08/2021   Wt Readings from Last 3 Encounters:  01/14/22 173 lb (78.5 kg)  10/08/21 175 lb (79.4 kg)   Last seen for diabetes 3 months ago.  Management since then includes start farxiga 10 mg and glipizide 5 mg BID to assist. . He reports excellent compliance with treatment. He is not having side effects.   Symptoms: No fatigue No foot ulcerations  No appetite changes No nausea  No paresthesia of the feet  Yes polydipsia  No polyuria No visual disturbances   No vomiting    Most Recent Eye Exam: 1 year ago Current exercise: walking  Pertinent Labs: Lab Results  Component Value Date   CHOL 191 10/08/2021   HDL 55 10/08/2021   LDLCALC 115 (H) 10/08/2021   TRIG 116 10/08/2021   CHOLHDL 3.5 10/08/2021   Lab Results  Component Value Date   NA 140 10/08/2021   K 4.4 10/08/2021   CREATININE 1.32 (H) 10/08/2021   EGFR 58 (L) 10/08/2021     ---------------------------------------------------------------------------------------------------   Medications: Outpatient Medications Prior to Visit  Medication Sig   omeprazole (PRILOSEC) 20 MG capsule Take 1 capsule (20 mg total) by mouth daily.   [DISCONTINUED] amLODipine (NORVASC) 10 MG tablet Take 10 mg by mouth daily.   [DISCONTINUED] dapagliflozin propanediol (FARXIGA) 10 MG  TABS tablet Take 1 tablet (10 mg total) by mouth daily before breakfast.   [DISCONTINUED] glipiZIDE (GLUCOTROL) 5 MG tablet Take 1 tablet (5 mg total) by mouth 2 (two) times daily before a meal.   [DISCONTINUED] hydrochlorothiazide (HYDRODIURIL) 25 MG tablet Take 25 mg by mouth daily.   [DISCONTINUED] lisinopril (ZESTRIL) 40 MG tablet Take 40 mg by mouth daily.   [DISCONTINUED] rosuvastatin (CRESTOR) 20 MG tablet Take 1 tablet (20 mg total) by mouth daily.   No facility-administered medications prior to visit.   Review of Systems    Objective    BP (!) 154/98 (BP Location: Left Arm, Patient Position: Sitting, Cuff Size: Normal)   Pulse 86   Temp 97.8 F (36.6 C) (Oral)   Wt 173 lb (78.5 kg)   SpO2 100%   BMI 24.82 kg/m   Physical Exam Vitals and nursing note reviewed.  Constitutional:      Appearance: Normal appearance. He is normal weight.  HENT:     Head: Normocephalic and atraumatic.  Eyes:     Pupils: Pupils are equal, round, and reactive to light.  Cardiovascular:     Rate and Rhythm: Normal rate and regular rhythm.     Pulses:          Posterior tibial pulses are 1+ on the right side and 1+ on the left side.     Heart sounds: Normal heart sounds.  Pulmonary:     Effort: Pulmonary effort is normal.     Breath sounds:  Normal breath sounds.  Musculoskeletal:        General: Normal range of motion.     Cervical back: Normal range of motion.       Feet:  Feet:     Right foot:     Protective Sensation: 10 sites tested.  10 sites sensed.     Skin integrity: Callus and dry skin present.     Toenail Condition: Right toenails are abnormally thick. Fungal disease present.    Left foot:     Protective Sensation: 10 sites tested.  10 sites sensed.     Skin integrity: Callus and dry skin present.     Toenail Condition: Left toenails are abnormally thick. Fungal disease present. Skin:    General: Skin is warm and dry.     Capillary Refill: Capillary refill takes less than  2 seconds.  Neurological:     General: No focal deficit present.     Mental Status: He is alert and oriented to person, place, and time. Mental status is at baseline.  Psychiatric:        Mood and Affect: Mood normal.        Behavior: Behavior normal.        Thought Content: Thought content normal.        Judgment: Judgment normal.     No results found for any visits on 01/14/22.  Assessment & Plan     Problem List Items Addressed This Visit       Cardiovascular and Mediastinum   Hypertension associated with diabetes ([place])    Chronic, elevated today Pt notes being out of Norvasc and Lisinopril for 2 weeks Advised pt to let us know if he is out of medication in the future Restart medications in addition to HCTZ 25 mg Goal <130/<80      Relevant Medications   amLODipine (NORVASC) 10 MG tablet   lisinopril (ZESTRIL) 40 MG tablet   hydrochlorothiazide (HYDRODIURIL) 25 MG tablet   rosuvastatin (CRESTOR) 20 MG tablet   dapagliflozin propanediol (FARXIGA) 10 MG TABS tablet   glipiZIDE (GLUCOTROL) 5 MG tablet     Digestive   Reactivation of hepatitis C viral hepatitis    Complete hep c genotyping      Relevant Orders   Hepatitis C genotype   Hepatitis C RNA quantitative   Hepatitis c vrs RNA detect by PCR-qual   HCV Ab w Reflex to Quant PCR     Endocrine   Uncontrolled diabetes mellitus with hyperglycemia, without long-term current use of insulin (HCC) - Primary    Chronic, previously elevated Reports no concerns; has not been checking his A1c Goal <7% Continue Farxiga 10 mg, Glipizide 5 mg BID Continue to recommend balanced, lower carb meals. Smaller meal size, adding snacks. Choosing water as drink of choice and increasing purposeful exercise.       Relevant Medications   lisinopril (ZESTRIL) 40 MG tablet   rosuvastatin (CRESTOR) 20 MG tablet   dapagliflozin propanediol (FARXIGA) 10 MG TABS tablet   glipiZIDE (GLUCOTROL) 5 MG tablet   Other Relevant Orders    Hemoglobin A1c   Urine Microalbumin w/creat. ratio     Musculoskeletal and Integument   Foot callus    Acute on chronic; recommend referral to podiatry given hx of DM      Relevant Orders   Ambulatory referral to Podiatry   Other Visit Diagnoses     Hyperlipidemia associated with type 2 diabetes mellitus ([place])       Relevant  Medications   amLODipine (NORVASC) 10 MG tablet   lisinopril (ZESTRIL) 40 MG tablet   hydrochlorothiazide (HYDRODIURIL) 25 MG tablet   rosuvastatin (CRESTOR) 20 MG tablet   dapagliflozin propanediol (FARXIGA) 10 MG TABS tablet   glipiZIDE (GLUCOTROL) 5 MG tablet   Mild intermittent reactive airway disease without complication       Relevant Medications   albuterol (VENTOLIN HFA) 108 (90 Base) MCG/ACT inhaler      Return in about 3 months (around 04/15/2022).     Vonna Kotyk, FNP, have reviewed all documentation for this visit. The documentation on 01/14/22 for the exam, diagnosis, procedures, and orders are all accurate and complete.  [name], [place] [phone] (phone) [phone] (fax)  [place]

## 2022-01-14 ENCOUNTER — Encounter: Payer: Self-pay | Admitting: Family Medicine

## 2022-01-14 ENCOUNTER — Ambulatory Visit (INDEPENDENT_AMBULATORY_CARE_PROVIDER_SITE_OTHER): Payer: Medicare Other | Admitting: Family Medicine

## 2022-01-14 VITALS — BP 154/98 | HR 86 | Temp 97.8°F | Wt 173.0 lb

## 2022-01-14 DIAGNOSIS — I152 Hypertension secondary to endocrine disorders: Secondary | ICD-10-CM | POA: Diagnosis not present

## 2022-01-14 DIAGNOSIS — Z8639 Personal history of other endocrine, nutritional and metabolic disease: Secondary | ICD-10-CM

## 2022-01-14 DIAGNOSIS — E1169 Type 2 diabetes mellitus with other specified complication: Secondary | ICD-10-CM

## 2022-01-14 DIAGNOSIS — E785 Hyperlipidemia, unspecified: Secondary | ICD-10-CM | POA: Diagnosis not present

## 2022-01-14 DIAGNOSIS — E1159 Type 2 diabetes mellitus with other circulatory complications: Secondary | ICD-10-CM | POA: Diagnosis not present

## 2022-01-14 DIAGNOSIS — B1920 Unspecified viral hepatitis C without hepatic coma: Secondary | ICD-10-CM | POA: Diagnosis not present

## 2022-01-14 DIAGNOSIS — E1165 Type 2 diabetes mellitus with hyperglycemia: Secondary | ICD-10-CM | POA: Insufficient documentation

## 2022-01-14 DIAGNOSIS — L84 Corns and callosities: Secondary | ICD-10-CM | POA: Diagnosis not present

## 2022-01-14 DIAGNOSIS — J4520 Mild intermittent asthma, uncomplicated: Secondary | ICD-10-CM

## 2022-01-14 MED ORDER — HYDROCHLOROTHIAZIDE 25 MG PO TABS: 25.0000 mg | ORAL_TABLET | Freq: Every day | ORAL | 3 refills | Status: AC

## 2022-01-14 MED ORDER — DAPAGLIFLOZIN PROPANEDIOL 10 MG PO TABS: 10.0000 mg | ORAL_TABLET | Freq: Every day | ORAL | 3 refills | Status: DC

## 2022-01-14 MED ORDER — LISINOPRIL 40 MG PO TABS: 40.0000 mg | ORAL_TABLET | Freq: Every day | ORAL | 3 refills | Status: AC

## 2022-01-14 MED ORDER — ALBUTEROL SULFATE HFA 108 (90 BASE) MCG/ACT IN AERS: 2.0000 | INHALATION_SPRAY | Freq: Four times a day (QID) | RESPIRATORY_TRACT | 2 refills | Status: DC | PRN

## 2022-01-14 MED ORDER — ROSUVASTATIN CALCIUM 20 MG PO TABS: 20.0000 mg | ORAL_TABLET | Freq: Every day | ORAL | 3 refills | Status: AC

## 2022-01-14 MED ORDER — AMLODIPINE BESYLATE 10 MG PO TABS: 10.0000 mg | ORAL_TABLET | Freq: Every day | ORAL | 3 refills | Status: AC

## 2022-01-14 MED ORDER — GLIPIZIDE 5 MG PO TABS: 5.0000 mg | ORAL_TABLET | Freq: Two times a day (BID) | ORAL | 3 refills | Status: DC

## 2022-01-14 NOTE — Assessment & Plan Note (Addendum)
Chronic, elevated today Pt notes being out of Norvasc and Lisinopril for 2 weeks Advised pt to let us know if he is out of medication in the future Restart medications in addition to HCTZ 25 mg Goal <130/<80

## 2022-01-14 NOTE — Assessment & Plan Note (Signed)
Chronic, previously elevated Reports no concerns; has not been checking his A1c Goal <7% Continue Farxiga 10 mg, Glipizide 5 mg BID Continue to recommend balanced, lower carb meals. Smaller meal size, adding snacks. Choosing water as drink of choice and increasing purposeful exercise.

## 2022-01-14 NOTE — Assessment & Plan Note (Signed)
Acute on chronic; recommend referral to podiatry given hx of DM

## 2022-01-14 NOTE — Assessment & Plan Note (Signed)
Complete hep c genotyping

## 2022-01-15 LAB — HCV RT-PCR, QUANT (NON-GRAPH)

## 2022-01-17 LAB — MICROALBUMIN / CREATININE URINE RATIO
Creatinine, Urine: 55.1 mg/dL
Microalb/Creat Ratio: 304 mg/g creat — ABNORMAL HIGH (ref 0–29)
Microalbumin, Urine: 167.3 ug/mL

## 2022-01-17 LAB — HCV AB W REFLEX TO QUANT PCR: HCV Ab: REACTIVE — AB

## 2022-01-17 LAB — HEPATITIS C GENOTYPE

## 2022-01-17 LAB — HCV RT-PCR, QUANT (NON-GRAPH): Hepatitis C Quantitation: NOT DETECTED IU/mL

## 2022-01-17 LAB — HEMOGLOBIN A1C
Est. average glucose Bld gHb Est-mCnc: 209 mg/dL
Hgb A1c MFr Bld: 8.9 % — ABNORMAL HIGH (ref 4.8–5.6)

## 2022-01-18 ENCOUNTER — Other Ambulatory Visit: Payer: Self-pay | Admitting: Family Medicine

## 2022-01-18 ENCOUNTER — Telehealth: Payer: Self-pay

## 2022-01-18 DIAGNOSIS — E1165 Type 2 diabetes mellitus with hyperglycemia: Secondary | ICD-10-CM

## 2022-01-18 NOTE — Telephone Encounter (Signed)
Copied from [place] [id number]. Topic: General - Call Back - No Documentation >> Jan 18, 2022  3:32 PM Cyndi Bender wrote: Reason for CRM: Pt stated he received a message to return call to office. Cb# [phone]  Patient advised of lab results. He reports his situation has improved and does not need to consult with pharmacist about medications. Advised to call back if anything changes.

## 2022-01-18 NOTE — Progress Notes (Signed)
Labs reviewed -A1c remains unchanged; recommend consult with pharmacist regarding next steps for medication assistance to diet/exercise changes -urine shows large about of protein from uncontrolled diabetes -Hep C viral load is undetectable

## 2022-01-19 ENCOUNTER — Telehealth: Payer: Self-pay | Admitting: Family Medicine

## 2022-01-29 ENCOUNTER — Other Ambulatory Visit: Payer: Self-pay | Admitting: Family Medicine

## 2022-01-31 NOTE — Telephone Encounter (Signed)
Requested Prescriptions  Pending Prescriptions Disp Refills   omeprazole (PRILOSEC) 20 MG capsule [Pharmacy Med Name: OMEPRAZOLE DR 20 MG CAPSULE] 90 capsule 0    Sig: TAKE 1 CAPSULE BY MOUTH EVERY DAY     Gastroenterology: Proton Pump Inhibitors Passed - 01/29/2022 12:32 PM      Passed - Valid encounter within last 12 months    Recent Outpatient Visits           2 weeks ago Uncontrolled diabetes mellitus with hyperglycemia, without long-term current use of insulin [facility])   [facility] [name], FNP   3 months ago Primary hypertension   [facility] [name], FNP

## 2022-04-04 ENCOUNTER — Other Ambulatory Visit: Payer: Self-pay | Admitting: Family Medicine

## 2022-04-04 DIAGNOSIS — E1165 Type 2 diabetes mellitus with hyperglycemia: Secondary | ICD-10-CM

## 2022-04-04 MED ORDER — DAPAGLIFLOZIN PROPANEDIOL 10 MG PO TABS: 10.0000 mg | ORAL_TABLET | Freq: Every day | ORAL | 3 refills | Status: DC

## 2022-04-05 NOTE — Progress Notes (Unsigned)
I,Nicholas Werner,acting as a scribe for Nicholas Sprout, FNP.,have documented all relevant documentation on the behalf of Nicholas Sprout, FNP,as directed by  Nicholas Sprout, FNP while in the presence of Nicholas Sprout, FNP.   Annual Wellness Visit     Patient: Nicholas Werner, Male    DOB: September 30, 1950, 72 y.o.   MRN: [id number] Visit Date: 04/06/2022  Today's Provider: [name], FNP  Re Introduced to nurse practitioner role and practice setting.  All questions answered.  Discussed provider/patient relationship and expectations.   Chief Complaint  Patient presents with   Annual Exam    AWV   Subjective    Nicholas Werner is a 72 y.o. male who presents today for his Annual Wellness Visit. He reports consuming a general diet. Home exercise routine includes walking 2 miles a day Monday - Thursday  hrs per day. He generally feels fairly well. He reports sleeping well. He does not have additional problems to discuss today.   HPI    Medications: Outpatient Medications Prior to Visit  Medication Sig   albuterol (VENTOLIN HFA) 108 (90 Base) MCG/ACT inhaler Inhale 2 puffs into the lungs every 6 (six) hours as needed for wheezing or shortness of breath.   amLODipine (NORVASC) 10 MG tablet Take 1 tablet (10 mg total) by mouth daily.   glipiZIDE (GLUCOTROL) 5 MG tablet Take 1 tablet (5 mg total) by mouth 2 (two) times daily before a meal.   hydrochlorothiazide (HYDRODIURIL) 25 MG tablet Take 1 tablet (25 mg total) by mouth daily.   lisinopril (ZESTRIL) 40 MG tablet Take 1 tablet (40 mg total) by mouth daily.   omeprazole (PRILOSEC) 20 MG capsule TAKE 1 CAPSULE BY MOUTH EVERY DAY   rosuvastatin (CRESTOR) 20 MG tablet Take 1 tablet (20 mg total) by mouth daily.   [DISCONTINUED] dapagliflozin propanediol (FARXIGA) 10 MG TABS tablet Take 1 tablet (10 mg total) by mouth daily before breakfast. (Patient not taking: Reported on 04/06/2022)   No facility-administered medications prior to visit.     Allergies  Allergen Reactions   Farxiga [Dapagliflozin] Other (See Comments)    Dizzy, blurred vision, near-syncope    Patient Care Team: Nicholas Sprout, FNP as PCP - General (Family Medicine)  Review of Systems  Cardiovascular:  Negative for chest pain, palpitations and leg swelling.  Musculoskeletal:  Positive for arthralgias.  All other systems reviewed and are negative.   Last CBC Lab Results  Component Value Date   WBC 4.8 10/08/2021   HGB 13.5 10/08/2021   HCT 42.0 10/08/2021   MCV 83 10/08/2021   MCH 26.8 10/08/2021   RDW 12.2 10/08/2021   PLT 243 [id number]   Last metabolic panel Lab Results  Component Value Date   GLUCOSE 164 (H) 10/08/2021   NA 140 10/08/2021   K 4.4 10/08/2021   CL 96 10/08/2021   CO2 28 10/08/2021   BUN 19 10/08/2021   CREATININE 1.32 (H) 10/08/2021   EGFR 58 (L) 10/08/2021   CALCIUM 10.0 10/08/2021   PROT 7.2 10/08/2021   ALBUMIN 4.3 10/08/2021   LABGLOB 2.9 10/08/2021   AGRATIO 1.5 10/08/2021   BILITOT 0.3 10/08/2021   ALKPHOS 125 (H) 10/08/2021   AST 17 10/08/2021   ALT 14 10/08/2021   ANIONGAP 2 (L) 05/04/2012   Last lipids Lab Results  Component Value Date   CHOL 191 10/08/2021   HDL 55 10/08/2021   LDLCALC 115 (H) 10/08/2021   TRIG 116  10/08/2021   CHOLHDL 3.5 10/08/2021   Last hemoglobin A1c Lab Results  Component Value Date   HGBA1C 8.9 (H) 01/14/2022   Last thyroid functions Lab Results  Component Value Date   TSH 0.842 10/08/2021       Objective    Vitals: BP 99/80 (BP Location: Left Arm)   Pulse 89   Temp 98.1 F (36.7 C) (Oral)   Resp 19   Ht 5\' 8"  (1.727 m)   Wt 169 lb 1.6 oz (76.7 kg)   SpO2 100%   BMI 25.71 kg/m  BP Readings from Last 3 Encounters:  04/06/22 99/80  01/14/22 (!) 154/98  10/08/21 125/78   Wt Readings from Last 3 Encounters:  04/06/22 169 lb 1.6 oz (76.7 kg)  01/14/22 173 lb (78.5 kg)  10/08/21 175 lb (79.4 kg)       Physical Exam Vitals and nursing note reviewed.   Constitutional:      General: He is awake. He is not in acute distress.    Appearance: Normal appearance. He is well-developed, well-groomed and normal weight. He is not ill-appearing, toxic-appearing or diaphoretic.  HENT:     Head: Normocephalic and atraumatic.     Jaw: There is normal jaw occlusion. No trismus, tenderness, swelling or pain on movement.     Salivary Glands: Right salivary gland is not diffusely enlarged or tender. Left salivary gland is not diffusely enlarged or tender.     Right Ear: Hearing, tympanic membrane, ear canal and external ear normal. There is no impacted cerumen.     Left Ear: Hearing, tympanic membrane, ear canal and external ear normal. There is no impacted cerumen.     Nose: Nose normal. No congestion or rhinorrhea.     Right Turbinates: Not enlarged, swollen or pale.     Left Turbinates: Not enlarged, swollen or pale.     Right Sinus: No maxillary sinus tenderness or frontal sinus tenderness.     Left Sinus: No maxillary sinus tenderness or frontal sinus tenderness.     Mouth/Throat:     Lips: Pink.     Mouth: Mucous membranes are moist. No injury, lacerations, oral lesions or angioedema.     Pharynx: Oropharynx is clear. Uvula midline. No pharyngeal swelling, oropharyngeal exudate or posterior oropharyngeal erythema.     Tonsils: No tonsillar exudate or tonsillar abscesses.  Eyes:     General: Lids are normal. Vision grossly intact. Gaze aligned appropriately.        Right eye: No discharge.        Left eye: No discharge.     Extraocular Movements: Extraocular movements intact.     Conjunctiva/sclera: Conjunctivae normal.     Pupils: Pupils are equal, round, and reactive to light.  Neck:     Thyroid: No thyroid mass, thyromegaly or thyroid tenderness.     Vascular: No carotid bruit.     Trachea: Trachea normal. No tracheal tenderness.  Cardiovascular:     Rate and Rhythm: Normal rate and regular rhythm.     Pulses: Normal pulses.           Carotid pulses are 2+ on the right side and 2+ on the left side.      Radial pulses are 2+ on the right side and 2+ on the left side.       Femoral pulses are 2+ on the right side and 2+ on the left side.      Popliteal pulses are 2+ on the right side and  2+ on the left side.       Dorsalis pedis pulses are 2+ on the right side and 2+ on the left side.       Posterior tibial pulses are 2+ on the right side and 2+ on the left side.     Heart sounds: Normal heart sounds, S1 normal and S2 normal. No murmur heard.    No friction rub. No gallop.  Pulmonary:     Effort: Pulmonary effort is normal. No respiratory distress.     Breath sounds: Normal breath sounds and air entry. No stridor. No wheezing, rhonchi or rales.  Chest:     Chest wall: No tenderness.  Abdominal:     General: Abdomen is flat. Bowel sounds are normal. There is no distension.     Palpations: Abdomen is soft. There is no mass.     Tenderness: There is no abdominal tenderness. There is no guarding or rebound.     Hernia: No hernia is present.  Genitourinary:    Comments: Exam deferred; denies complaints Musculoskeletal:        General: No swelling, tenderness, deformity or signs of injury. Normal range of motion.     Cervical back: Normal range of motion and neck supple. No rigidity or tenderness.     Right lower leg: No edema.     Left lower leg: No edema.  Lymphadenopathy:     Cervical: No cervical adenopathy.     Right cervical: No superficial, deep or posterior cervical adenopathy.    Left cervical: No superficial, deep or posterior cervical adenopathy.  Skin:    General: Skin is warm and dry.     Capillary Refill: Capillary refill takes less than 2 seconds.     Coloration: Skin is not jaundiced or pale.     Findings: No bruising, erythema, lesion or rash.  Neurological:     General: No focal deficit present.     Mental Status: He is alert and oriented to person, place, and time. Mental status is at baseline.      GCS: GCS eye subscore is 4. GCS verbal subscore is 5. GCS motor subscore is 6.     Sensory: Sensation is intact. No sensory deficit.     Motor: Motor function is intact. No weakness.     Coordination: Coordination is intact.     Gait: Gait is intact.  Psychiatric:        Attention and Perception: Attention and perception normal.        Mood and Affect: Mood and affect normal.        Speech: Speech normal.        Behavior: Behavior normal. Behavior is cooperative.        Thought Content: Thought content normal.        Cognition and Memory: Cognition normal.        Judgment: Judgment normal.    Most recent functional status assessment:    04/06/2022    9:02 AM  In your present state of health, do you have any difficulty performing the following activities:  Hearing? 0  Vision? 0  Difficulty concentrating or making decisions? 0  Walking or climbing stairs? 0  Dressing or bathing? 0   Most recent fall risk assessment:    04/06/2022    9:02 AM  Fall Risk   Falls in the past year? 0  Number falls in past yr: 0  Injury with Fall? 0  Risk for fall due to : No Fall  Risks    Most recent depression screenings:    04/06/2022    9:02 AM 10/08/2021    2:12 PM  PHQ 2/9 Scores  PHQ - 2 Score 0 0  PHQ- 9 Score 0 1   Most recent cognitive screening:    04/06/2022    8:52 AM  6CIT Screen  What Year? 0 points  What month? 0 points  What time? 0 points  Count back from 20 0 points  Months in reverse 0 points  Repeat phrase 0 points  Total Score 0 points   Most recent Audit-C alcohol use screening    04/06/2022    9:02 AM  Alcohol Use Disorder Test (AUDIT)  1. How often do you have a drink containing alcohol? 0   A score of 3 or more in women, and 4 or more in men indicates increased risk for alcohol abuse, EXCEPT if all of the points are from question 1   No results found for any visits on 04/06/22.  Assessment & Plan     Annual wellness visit done today including the all  of the following: Reviewed patient's Family Medical History Reviewed and updated list of patient's medical providers Assessment of cognitive impairment was done Assessed patient's functional ability Established a written schedule for health screening [place] Completed and Reviewed  Exercise Activities and Dietary recommendations  Goals   None     Immunization History  Administered Date(s) Administered   Zoster Recombinat (Shingrix) 11/23/2001    Health Maintenance  Topic Date Due   COVID-19 Vaccine (1) Never done   OPHTHALMOLOGY EXAM  Never done   DTaP/Tdap/Td (1 - Tdap) Never done   COLONOSCOPY (Pts 45-70yrs Insurance coverage will need to be confirmed)  Never done   Zoster Vaccines- Shingrix (2 of 2) 01/18/2002   INFLUENZA VACCINE  04/17/2022 (Originally 08/17/2021)   Pneumonia Vaccine 76+ Years old (1 of 2 - PCV) 10/09/2022 (Originally 11/23/1956)   HEMOGLOBIN A1C  07/16/2022   Diabetic kidney evaluation - eGFR measurement  10/09/2022   Diabetic kidney evaluation - Urine ACR  01/15/2023   FOOT EXAM  01/15/2023   Medicare Annual Wellness (AWV)  04/06/2023   Hepatitis C Screening  Completed   HPV VACCINES  Aged Out     Discussed health benefits of physical activity, and encouraged him to engage in regular exercise appropriate for his age and condition.    Problem List Items Addressed This Visit       Cardiovascular and Mediastinum   Hypertension associated with diabetes ([place])    Chronic, previously elevated Borderline low today Reports intolerance of farxiga Remains on norvasc 10 mg (however, not present), hctz 25 mg, lisinopril 40 mg Goal <130/<80 Recommend labs and then will plan on reduction of medications       Relevant Orders   CBC with Differential/Platelet   Comprehensive Metabolic Panel (CMET)     Digestive   Gastroesophageal reflux disease without esophagitis    Chronic, stable Remains on prilosec 20 mg         Endocrine    Hyperlipidemia associated with type 2 diabetes mellitus (HCC)    Chronic, previously elevated now on crestor 20 mg LDL goal of <70 with DM       Relevant Orders   Lipid panel   Uncontrolled diabetes mellitus with hyperglycemia, without long-term current use of insulin (HCC)    Chronic, previously >10% Was started on farxiga 10 mg; not able to tolerate  Started on  glipizide 5 mg BID Is working on changing his diet- poor drinks/food Remains active One ACEi and Statin       Relevant Orders   Hemoglobin A1c     Other   Encounter for subsequent annual wellness visit (AWV) in Medicare patient - Primary    UTD on vision and dental Continues to work Declines vaccines and colon cancer screening at this time       Screening for prostate cancer    Reports nocturia x1/night Repeat PSA      Relevant Orders   PSA   Tobacco use disorder, moderate, dependence    Chronic, improved Reports 10- now 8 cigs/day Trial of 7 mg patch       Relevant Medications   nicotine (NICODERM CQ - DOSED IN MG/24 HR) 7 mg/24hr patch   sReturn in about 3 months (around 07/07/2022) for HTN management, T2DM management.    Vonna Kotyk, FNP, have reviewed all documentation for this visit. The documentation on 04/06/22 for the exam, diagnosis, procedures, and orders are all accurate and complete.  Nicholas Werner, [place] [phone] (phone) [phone] (fax)  [place]

## 2022-04-06 ENCOUNTER — Encounter: Payer: Self-pay | Admitting: Family Medicine

## 2022-04-06 ENCOUNTER — Ambulatory Visit (INDEPENDENT_AMBULATORY_CARE_PROVIDER_SITE_OTHER): Payer: Medicare Other | Admitting: Family Medicine

## 2022-04-06 VITALS — BP 99/80 | HR 89 | Temp 98.1°F | Resp 19 | Ht 68.0 in | Wt 169.1 lb

## 2022-04-06 DIAGNOSIS — I152 Hypertension secondary to endocrine disorders: Secondary | ICD-10-CM | POA: Diagnosis not present

## 2022-04-06 DIAGNOSIS — E1165 Type 2 diabetes mellitus with hyperglycemia: Secondary | ICD-10-CM

## 2022-04-06 DIAGNOSIS — Z7984 Long term (current) use of oral hypoglycemic drugs: Secondary | ICD-10-CM | POA: Diagnosis not present

## 2022-04-06 DIAGNOSIS — E1159 Type 2 diabetes mellitus with other circulatory complications: Secondary | ICD-10-CM | POA: Diagnosis not present

## 2022-04-06 DIAGNOSIS — E1169 Type 2 diabetes mellitus with other specified complication: Secondary | ICD-10-CM

## 2022-04-06 DIAGNOSIS — F17210 Nicotine dependence, cigarettes, uncomplicated: Secondary | ICD-10-CM | POA: Diagnosis not present

## 2022-04-06 DIAGNOSIS — E785 Hyperlipidemia, unspecified: Secondary | ICD-10-CM | POA: Diagnosis not present

## 2022-04-06 DIAGNOSIS — K219 Gastro-esophageal reflux disease without esophagitis: Secondary | ICD-10-CM

## 2022-04-06 DIAGNOSIS — Z0000 Encounter for general adult medical examination without abnormal findings: Secondary | ICD-10-CM | POA: Diagnosis not present

## 2022-04-06 DIAGNOSIS — F17200 Nicotine dependence, unspecified, uncomplicated: Secondary | ICD-10-CM

## 2022-04-06 DIAGNOSIS — Z125 Encounter for screening for malignant neoplasm of prostate: Secondary | ICD-10-CM

## 2022-04-06 MED ORDER — NICOTINE 7 MG/24HR TD PT24: 7.0000 mg | MEDICATED_PATCH | Freq: Every day | TRANSDERMAL | 1 refills | Status: AC

## 2022-04-06 NOTE — Assessment & Plan Note (Signed)
Chronic, previously elevated now on crestor 20 mg LDL goal of <70 with DM

## 2022-04-06 NOTE — Assessment & Plan Note (Signed)
Chronic, stable Remains on prilosec 20 mg

## 2022-04-06 NOTE — Assessment & Plan Note (Signed)
Reports nocturia x1/night Repeat PSA

## 2022-04-06 NOTE — Assessment & Plan Note (Signed)
Chronic, improved Reports 10- now 8 cigs/day Trial of 7 mg patch

## 2022-04-06 NOTE — Assessment & Plan Note (Signed)
UTD on vision and dental Continues to work Declines vaccines and colon cancer screening at this time

## 2022-04-06 NOTE — Assessment & Plan Note (Signed)
Chronic, previously elevated Borderline low today Reports intolerance of farxiga Remains on norvasc 10 mg (however, not present), hctz 25 mg, lisinopril 40 mg Goal <130/<80 Recommend labs and then will plan on reduction of medications

## 2022-04-06 NOTE — Assessment & Plan Note (Signed)
Chronic, previously >10% Was started on farxiga 10 mg; not able to tolerate  Started on glipizide 5 mg BID Is working on changing his diet- poor drinks/food Remains active One ACEi and Statin

## 2022-04-07 ENCOUNTER — Other Ambulatory Visit: Payer: Self-pay | Admitting: Family Medicine

## 2022-04-07 DIAGNOSIS — N1831 Chronic kidney disease, stage 3a: Secondary | ICD-10-CM | POA: Insufficient documentation

## 2022-04-07 DIAGNOSIS — E1165 Type 2 diabetes mellitus with hyperglycemia: Secondary | ICD-10-CM

## 2022-04-07 LAB — CBC WITH DIFFERENTIAL/PLATELET
Basophils Absolute: 0 10*3/uL (ref 0.0–0.2)
Basos: 0 %
EOS (ABSOLUTE): 0.2 10*3/uL (ref 0.0–0.4)
Eos: 3 %
Hematocrit: 42.8 % (ref 37.5–51.0)
Hemoglobin: 14 g/dL (ref 13.0–17.7)
Immature Grans (Abs): 0 10*3/uL (ref 0.0–0.1)
Immature Granulocytes: 0 %
Lymphocytes Absolute: 1.5 10*3/uL (ref 0.7–3.1)
Lymphs: 31 %
MCH: 27.6 pg (ref 26.6–33.0)
MCHC: 32.7 g/dL (ref 31.5–35.7)
MCV: 84 fL (ref 79–97)
Monocytes Absolute: 0.4 10*3/uL (ref 0.1–0.9)
Monocytes: 8 %
Neutrophils Absolute: 2.8 10*3/uL (ref 1.4–7.0)
Neutrophils: 58 %
Platelets: 271 10*3/uL (ref 150–450)
RBC: 5.08 x10E6/uL (ref 4.14–5.80)
RDW: 11.8 % (ref 11.6–15.4)
WBC: 4.9 10*3/uL (ref 3.4–10.8)

## 2022-04-07 LAB — LIPID PANEL
Chol/HDL Ratio: 2.1 ratio (ref 0.0–5.0)
Cholesterol, Total: 136 mg/dL (ref 100–199)
HDL: 65 mg/dL (ref >39)
LDL Chol Calc (NIH): 57 mg/dL (ref 0–99)
Triglycerides: 70 mg/dL (ref 0–149)
VLDL Cholesterol Cal: 14 mg/dL (ref 5–40)

## 2022-04-07 LAB — COMPREHENSIVE METABOLIC PANEL
ALT: 17 IU/L (ref 0–44)
AST: 21 IU/L (ref 0–40)
Albumin/Globulin Ratio: 1.5 (ref 1.2–2.2)
Albumin: 4.5 g/dL (ref 3.9–4.9)
Alkaline Phosphatase: 120 IU/L (ref 44–121)
BUN/Creatinine Ratio: 15 (ref 10–24)
BUN: 23 mg/dL (ref 8–27)
Bilirubin Total: 0.8 mg/dL (ref 0.0–1.2)
CO2: 25 mmol/L (ref 20–29)
Calcium: 10.6 mg/dL — ABNORMAL HIGH (ref 8.6–10.2)
Chloride: 96 mmol/L (ref 96–106)
Creatinine, Ser: 1.56 mg/dL — ABNORMAL HIGH (ref 0.76–1.27)
Globulin, Total: 3 g/dL (ref 1.5–4.5)
Glucose: 209 mg/dL — ABNORMAL HIGH (ref 70–99)
Potassium: 4.8 mmol/L (ref 3.5–5.2)
Sodium: 136 mmol/L (ref 134–144)
Total Protein: 7.5 g/dL (ref 6.0–8.5)
eGFR: 47 mL/min/{1.73_m2} — ABNORMAL LOW (ref >59)

## 2022-04-07 LAB — PSA: Prostate Specific Ag, Serum: 1.5 ng/mL (ref 0.0–4.0)

## 2022-04-07 LAB — HEMOGLOBIN A1C
Est. average glucose Bld gHb Est-mCnc: 263 mg/dL
Hgb A1c MFr Bld: 10.8 % — ABNORMAL HIGH (ref 4.8–5.6)

## 2022-04-07 NOTE — Progress Notes (Signed)
Recommend repeat BMP in 1 week. Given significant increase in in 6 month time line.  Creatinine is increased and eGFR is decreased. Recommend [place] of kidneys and consult with nephrology and pharmacy team.  A1c is increased despite start of medication and complication with farxiga. May benefit from once nightly long acting insulin.

## 2022-04-08 ENCOUNTER — Other Ambulatory Visit: Payer: Self-pay | Admitting: *Deleted

## 2022-04-08 ENCOUNTER — Other Ambulatory Visit: Payer: Self-pay | Admitting: Family Medicine

## 2022-04-08 DIAGNOSIS — N1831 Chronic kidney disease, stage 3a: Secondary | ICD-10-CM

## 2022-04-11 ENCOUNTER — Ambulatory Visit
Admission: RE | Admit: 2022-04-11 | Discharge: 2022-04-11 | Disposition: A | Discharge status: Home / Self Care | Payer: Medicare Other | Source: Ambulatory Visit | Attending: Family Medicine | Admitting: Family Medicine

## 2022-04-11 DIAGNOSIS — N281 Cyst of kidney, acquired: Secondary | ICD-10-CM | POA: Diagnosis not present

## 2022-04-11 DIAGNOSIS — N1831 Chronic kidney disease, stage 3a: Secondary | ICD-10-CM | POA: Diagnosis not present

## 2022-04-12 ENCOUNTER — Telehealth: Payer: Self-pay

## 2022-04-12 NOTE — Telephone Encounter (Signed)
Copied from [place] [id number]. Topic: General - Other >> Apr 11, 2022  5:11 PM Cyndi Bender wrote: Reason for CRM: [facility] ultrasound dept requests that the order be faxed to them at [phone]

## 2022-04-12 NOTE — Progress Notes (Signed)
Renal US notes small renal cysts as well as cortical damage within the kidneys most suggestive of renal disease. Continue to recommend follow up with nephrology to assist.

## 2022-04-14 ENCOUNTER — Telehealth: Payer: Self-pay

## 2022-04-14 NOTE — Progress Notes (Signed)
  Chronic Care Management   Note  04/14/2022 Name: Nicholas Werner MRN: [id number] DOB: 1950-06-13  Nicholas Werner is a 72 y.o. year old male who is a primary care patient of Nicholas Sprout, Nicholas Werner. I reached out to Nicholas Werner by phone today in response to a referral sent by Nicholas Werner PCP.  The first contact attempt was unsuccessful.   Follow up plan: Additional outreach attempts will be made.  Nicholas Werner, Nicholas Werner, Nicholas Werner Direct Dial: [phone] [email or web address]

## 2022-04-14 NOTE — Progress Notes (Signed)
  Chronic Care Management   Note  04/14/2022 Name: Nicholas Werner MRN: [id number] DOB: 10/22/1950  Nicholas Werner is a 72 y.o. year old male who is a primary care patient of [name], FNP. I reached out to Sharlette Dense by phone today in response to a referral sent by Nicholas Werner PCP.  Nicholas Werner was given information about Chronic Care Management services today including:  CCM service includes personalized support from designated clinical staff supervised by the physician, including individualized plan of care and coordination with other care providers 24/7 contact phone numbers for assistance for urgent and routine care needs. Service will only be billed when office clinical staff spend 20 minutes or more in a month to coordinate care. Only one practitioner may furnish and bill the service in a calendar month. The patient may stop CCM services at amy time (effective at the end of the month) by phone call to the office staff. The patient will be responsible for cost sharing (co-pay) or up to 20% of the service fee (after annual deductible is met)  Nicholas Werner  declinedto scheduling an appointment with the CCM Pharmacist   Follow up plan: Patient did not agree to scheduling an appointment with the Pharmacist. The ordering provider has been notified.  Noreene Larsson, [place], [place] [address] Direct Dial: [phone] [email or web address]

## 2022-04-15 ENCOUNTER — Other Ambulatory Visit: Payer: Self-pay

## 2022-04-15 DIAGNOSIS — N1831 Chronic kidney disease, stage 3a: Secondary | ICD-10-CM | POA: Diagnosis not present

## 2022-04-16 LAB — BASIC METABOLIC PANEL
BUN/Creatinine Ratio: 16 (ref 10–24)
BUN: 27 mg/dL (ref 8–27)
CO2: 23 mmol/L (ref 20–29)
Calcium: 10.4 mg/dL — ABNORMAL HIGH (ref 8.6–10.2)
Chloride: 93 mmol/L — ABNORMAL LOW (ref 96–106)
Creatinine, Ser: 1.65 mg/dL — ABNORMAL HIGH (ref 0.76–1.27)
Glucose: 236 mg/dL — ABNORMAL HIGH (ref 70–99)
Potassium: 4.8 mmol/L (ref 3.5–5.2)
Sodium: 136 mmol/L (ref 134–144)
eGFR: 44 mL/min/{1.73_m2} — ABNORMAL LOW (ref >59)

## 2022-04-17 NOTE — Progress Notes (Signed)
Kidney function has continued to show decrease in waste clearance by low eGFR and elevated creatinine. Please follow up with a kidney specialist to assist.

## 2022-04-18 ENCOUNTER — Telehealth: Payer: Self-pay | Admitting: *Deleted

## 2022-04-18 NOTE — Telephone Encounter (Signed)
Patient returned call and notified:  Kidney function has continued to show decrease in waste clearance by low eGFR and elevated creatinine. Please follow up with a kidney specialist to assist.   Patient has appointment 04/27/22- that was the soonest appointment they had. Patient wants to make sure PCP is ok with that.

## 2022-04-19 NOTE — Telephone Encounter (Signed)
LM that is ok to wait for he 04/10 appointment with the kidney specialist

## 2022-04-27 DIAGNOSIS — I10 Essential (primary) hypertension: Secondary | ICD-10-CM | POA: Diagnosis not present

## 2022-04-27 DIAGNOSIS — E1122 Type 2 diabetes mellitus with diabetic chronic kidney disease: Secondary | ICD-10-CM | POA: Diagnosis not present

## 2022-04-27 DIAGNOSIS — N1832 Chronic kidney disease, stage 3b: Secondary | ICD-10-CM | POA: Diagnosis not present

## 2022-04-28 ENCOUNTER — Other Ambulatory Visit: Payer: Self-pay | Admitting: Nephrology

## 2022-04-28 DIAGNOSIS — N1832 Chronic kidney disease, stage 3b: Secondary | ICD-10-CM

## 2022-04-29 ENCOUNTER — Other Ambulatory Visit: Payer: Self-pay | Admitting: Family Medicine

## 2022-04-29 DIAGNOSIS — E1165 Type 2 diabetes mellitus with hyperglycemia: Secondary | ICD-10-CM

## 2022-04-29 DIAGNOSIS — J4520 Mild intermittent asthma, uncomplicated: Secondary | ICD-10-CM

## 2022-04-29 MED ORDER — ALBUTEROL SULFATE HFA 108 (90 BASE) MCG/ACT IN AERS: 2.0000 | INHALATION_SPRAY | Freq: Four times a day (QID) | RESPIRATORY_TRACT | 1 refills | Status: DC | PRN

## 2022-04-29 MED ORDER — OMEPRAZOLE 20 MG PO CPDR: 20.0000 mg | DELAYED_RELEASE_CAPSULE | Freq: Every day | ORAL | 0 refills | Status: DC

## 2022-04-29 NOTE — Telephone Encounter (Signed)
Requested by inteface surescripts. Medication discontinued 04/06/22. Requested Prescriptions  Signed Prescriptions Disp Refills   albuterol (VENTOLIN HFA) 108 (90 Base) MCG/ACT inhaler 8 g 1    Sig: Inhale 2 puffs into the lungs every 6 (six) hours as needed for wheezing or shortness of breath.     Pulmonology:  Beta Agonists 2 Passed - 04/29/2022 10:52 AM      Passed - Last BP in normal range    BP Readings from Last 1 Encounters:  04/06/22 99/80         Passed - Last Heart Rate in normal range    Pulse Readings from Last 1 Encounters:  04/06/22 89         Passed - Valid encounter within last 12 months    Recent Outpatient Visits           3 months ago Uncontrolled diabetes mellitus with hyperglycemia, without long-term current use of insulin (HCC)   [place] [facility] Jacky Kindle, FNP   6 months ago Primary hypertension   [place] [facility] Merita Norton T, FNP       Future Appointments             In 2 months Jacky Kindle, FNP [facility]             omeprazole (PRILOSEC) 20 MG capsule 90 capsule 0    Sig: Take 1 capsule (20 mg total) by mouth daily.     Gastroenterology: Proton Pump Inhibitors Passed - 04/29/2022 10:52 AM      Passed - Valid encounter within last 12 months    Recent Outpatient Visits           3 months ago Uncontrolled diabetes mellitus with hyperglycemia, without long-term current use of insulin (HCC)   [place] [facility] Merita Norton T, FNP   6 months ago Primary hypertension   [place] Merita Norton T, FNP       Future Appointments             In 2 months Jacky Kindle, FNP  [facility]            Refused Prescriptions Disp Refills   dapagliflozin propanediol (FARXIGA) 10 MG TABS tablet 90 tablet 3    Sig: Take 1 tablet (10 mg total) by mouth daily before breakfast.      Endocrinology:  Diabetes - SGLT2 Inhibitors Failed - 04/29/2022 10:52 AM      Failed - Cr in normal range and within 360 days    Creatinine  Date Value Ref Range Status  05/04/2012 1.16 0.60 - 1.30 mg/dL Final   Creatinine, Ser  Date Value Ref Range Status  04/15/2022 1.65 (H) 0.76 - 1.27 mg/dL Final         Failed - HBA1C is between 0 and 7.9 and within 180 days    Hgb A1c MFr Bld  Date Value Ref Range Status  04/06/2022 10.8 (H) 4.8 - 5.6 % Final    Comment:             Prediabetes: 5.7 - 6.4          Diabetes: >6.4          Glycemic control for adults with diabetes: <7.0          Failed - eGFR in normal range and within 360 days    EGFR (African American)  Date Value Ref Range Status  05/04/2012 >60  Final   EGFR (Non-African Amer.)  Date Value Ref Range Status  05/04/2012 >60  Final    Comment:    eGFR values <35mL/min/1.73 m2 may be an indication of chronic kidney disease (CKD). Calculated eGFR is useful in patients with stable renal function. The eGFR calculation will not be reliable in acutely ill patients when serum creatinine is changing rapidly. It is not useful in  patients on dialysis. The eGFR calculation may not be applicable to patients at the low and high extremes of body sizes, pregnant women, and vegetarians.    eGFR  Date Value Ref Range Status  04/15/2022 44 (L) >59 mL/min/1.73 Final         Passed - Valid encounter within last 6 months    Recent Outpatient Visits           3 months ago Uncontrolled diabetes mellitus with hyperglycemia, without long-term current use of insulin [facility])   [place] [facility] Jacky Kindle, FNP   6 months ago Primary hypertension   [place] [facility] Jacky Kindle, FNP       Future Appointments             In 2 months Jacky Kindle, FNP [facility]

## 2022-04-29 NOTE — Telephone Encounter (Signed)
  Medication Refill - Medication: albuterol (VENTOLIN HFA) 108 (90 Base) MCG/ACT inhaler  omeprazole (PRILOSEC) 20 MG capsule  Farxiga 10 MG  Has the patient contacted their pharmacy? Yes.   (Agent: If no, request that the patient contact the pharmacy for the refill. If patient does not wish to contact the pharmacy document the reason why and proceed with request.) (Agent: If yes, when and what did the pharmacy advise?)  Preferred Pharmacy (with phone number or street name):  [id number] [place], [place] - [address]  [address] [place] [address]  Phone: [phone] Fax: [phone]   Has the patient been seen for an appointment in the last year OR does the patient have an upcoming appointment? Yes.    Agent: Please be advised that RX refills may take up to 3 business days. We ask that you follow-up with your pharmacy.

## 2022-04-29 NOTE — Telephone Encounter (Signed)
Future visit in 2 months.  Requested Prescriptions  Pending Prescriptions Disp Refills   omeprazole (PRILOSEC) 20 MG capsule 90 capsule 0    Sig: Take 1 capsule (20 mg total) by mouth daily.     Gastroenterology: Proton Pump Inhibitors Passed - 04/29/2022 10:52 AM      Passed - Valid encounter within last 12 months    Recent Outpatient Visits           3 months ago Uncontrolled diabetes mellitus with hyperglycemia, without long-term current use of insulin (HCC)   [place] [facility] Merita Norton T, FNP   6 months ago Primary hypertension   [place] [facility] Merita Norton T, FNP       Future Appointments             In 2 months Jacky Kindle, FNP [place] [facility], [facility]             dapagliflozin propanediol (FARXIGA) 10 MG TABS tablet 90 tablet 3    Sig: Take 1 tablet (10 mg total) by mouth daily before breakfast.     Endocrinology:  Diabetes - SGLT2 Inhibitors Failed - 04/29/2022 10:52 AM      Failed - Cr in normal range and within 360 days    Creatinine  Date Value Ref Range Status  05/04/2012 1.16 0.60 - 1.30 mg/dL Final   Creatinine, Ser  Date Value Ref Range Status  04/15/2022 1.65 (H) 0.76 - 1.27 mg/dL Final         Failed - HBA1C is between 0 and 7.9 and within 180 days    Hgb A1c MFr Bld  Date Value Ref Range Status  04/06/2022 10.8 (H) 4.8 - 5.6 % Final    Comment:             Prediabetes: 5.7 - 6.4          Diabetes: >6.4          Glycemic control for adults with diabetes: <7.0          Failed - eGFR in normal range and within 360 days    EGFR (African American)  Date Value Ref Range Status  05/04/2012 >60  Final   EGFR (Non-African Amer.)  Date Value Ref Range Status  05/04/2012 >60  Final    Comment:    eGFR values <22mL/min/1.73 m2 may be an indication of chronic kidney disease (CKD). Calculated eGFR is useful in patients with stable renal function. The eGFR calculation will not be  reliable in acutely ill patients when serum creatinine is changing rapidly. It is not useful in  patients on dialysis. The eGFR calculation may not be applicable to patients at the low and high extremes of body sizes, pregnant women, and vegetarians.    eGFR  Date Value Ref Range Status  04/15/2022 44 (L) >59 mL/min/1.73 Final         Passed - Valid encounter within last 6 months    Recent Outpatient Visits           3 months ago Uncontrolled diabetes mellitus with hyperglycemia, without long-term current use of insulin [facility])   [place] [facility] Jacky Kindle, FNP   6 months ago Primary hypertension    [facility] Jacky Kindle, FNP       Future Appointments             In 2 months Jacky Kindle, FNP [facility], [facility]            Refused Prescriptions Disp Refills   albuterol (VENTOLIN HFA) 108 (90 Base) MCG/ACT inhaler 8 g 1    Sig: Inhale 2 puffs into the lungs every 6 (six) hours as needed for wheezing or shortness of breath.     Pulmonology:  Beta Agonists 2 Passed - 04/29/2022 10:52 AM      Passed - Last BP in normal range    BP Readings from Last 1 Encounters:  04/06/22 99/80         Passed - Last Heart Rate in normal range    Pulse Readings from Last 1 Encounters:  04/06/22 89         Passed - Valid encounter within last 12 months    Recent Outpatient Visits           3 months ago Uncontrolled diabetes mellitus with hyperglycemia, without long-term current use of insulin (HCC)   [place] [facility] Jacky Kindle, FNP   6 months ago Primary hypertension    [facility] Jacky Kindle, FNP       Future Appointments             In 2 months Jacky Kindle, FNP [facility]

## 2022-05-02 ENCOUNTER — Telehealth: Payer: Self-pay

## 2022-05-02 ENCOUNTER — Other Ambulatory Visit: Payer: Self-pay | Admitting: Family Medicine

## 2022-05-02 MED ORDER — DAPAGLIFLOZIN PROPANEDIOL 10 MG PO TABS: 10.0000 mg | ORAL_TABLET | Freq: Every day | ORAL | 11 refills | Status: AC

## 2022-05-02 NOTE — Telephone Encounter (Signed)
Patient advised. Verbalized understanding

## 2022-05-02 NOTE — Telephone Encounter (Signed)
Copied from CRM [id number]. Topic: General - Other >> May 02, 2022  8:53 AM Nicholas Werner wrote: Reason for CRM: please call patient regarding a medication that was discontinued and listed as one of his allergies according to the March 10th After visit summary and allergy list.  Dapagliflozin propanediol (FARXIGA) 10 MG TABS tablet. Patient stated he really not did not stop taking them, he just started taking them at night and would like discuss getting another refill and removing it from his allergy list.

## 2022-05-20 ENCOUNTER — Telehealth: Payer: Self-pay

## 2022-05-20 NOTE — Telephone Encounter (Signed)
Spoke with Seema from [facility] pharmacist and disclosed pt's most recent A1C (04/06/2022)

## 2022-05-20 NOTE — Telephone Encounter (Signed)
Copied from CRM [id number]. Topic: General - Other >> May 20, 2022 11:02 AM Dondra Prader A wrote: Reason for CRM: Osborne Casco Pharmacists with [facility] is calling to get the patients last hemoglobin A1C value and the one that was completed. Please call Osborne Casco back.

## 2022-06-15 ENCOUNTER — Other Ambulatory Visit: Payer: Self-pay | Admitting: Family Medicine

## 2022-06-15 DIAGNOSIS — J4520 Mild intermittent asthma, uncomplicated: Secondary | ICD-10-CM

## 2022-06-28 ENCOUNTER — Telehealth: Payer: Self-pay

## 2022-06-28 DIAGNOSIS — I152 Hypertension secondary to endocrine disorders: Secondary | ICD-10-CM

## 2022-06-28 DIAGNOSIS — E1165 Type 2 diabetes mellitus with hyperglycemia: Secondary | ICD-10-CM

## 2022-06-28 NOTE — Telephone Encounter (Signed)
PharmD reviewed patient chart to assess eligibility for [facility] Coordination services. Patient was determined to be a good candidate for the program given the complexity of the medication regimen and/or overall risk for hospitalization and increased utilization.  Referral entered in order to outreach patient and offer appointment with PharmD. Referral cosigned to PCP.   Alex Callahan Peddie, PharmD, BCACP, CPP  Clinical Pharmacist Practitioner  [facility] [phone]  

## 2022-07-04 ENCOUNTER — Encounter: Payer: Self-pay | Admitting: Pharmacist

## 2022-07-04 ENCOUNTER — Telehealth: Payer: Self-pay | Admitting: Pharmacist

## 2022-07-04 NOTE — Progress Notes (Signed)
   07/04/2022  Patient ID: Nicholas Werner, male   DOB: 1950-03-01, 72 y.o.   MRN: [id number]  Pharmacy Quality Measure Review  This patient is appearing on a report related to the adherence measure for diabetes medications this calendar year.   Reach patient by telephone this morning and patient requests call back in afternoon after 3 pm.  Was unable to reach patient via telephone this afternoon and have left HIPAA compliant voicemail asking patient to return my call.   Estelle Grumbles, PharmD, [facility] [phone]

## 2022-07-04 NOTE — Telephone Encounter (Signed)
This encounter was created in error - please disregard.

## 2022-07-05 ENCOUNTER — Ambulatory Visit: Payer: Self-pay

## 2022-07-05 NOTE — Patient Outreach (Signed)
  Care Coordination   Initial Visit Note   07/05/2022 Name: Nicholas Werner MRN: [id number] DOB: 11-06-1950  Nicholas Werner is a 72 y.o. year old male who sees Jacky Kindle, FNP for primary care. I spoke with  Simonne Come by phone today.  What matters to the patients health and wellness today?  CM educated patient on [facility] services.  Patient declines services and feels that he is able to manage his medical needs.  Patient agreed to contact his provider in the future if he needs [facility] services.Patient does state he is considering dropping Medicare due to the premium and just use the [facility].  CM encourage patient to speak to Medicaid to see if he qualifies for the Medicaid to coverage the cost of the Medicare premium.     Goals Addressed   None     SDOH assessments and interventions completed:  No     Care Coordination Interventions:  Yes, provided   -CM provided information on Medicaid to apply for program to pay for Medicare premium.  Follow up plan: No further intervention required.   Encounter Outcome:  Pt. Refused

## 2022-07-06 ENCOUNTER — Telehealth: Payer: Self-pay | Admitting: Pharmacist

## 2022-07-06 NOTE — Progress Notes (Signed)
   07/06/2022  Patient ID: Simonne Come, male   DOB: 1950-02-04, 72 y.o.   MRN: [id number]  Pharmacy Quality Measure Review  This patient is appearing on a report related to the adherence measure for diabetes medications this calendar year.   Was unable to reach patient via telephone today and have left HIPAA compliant voicemail asking patient to return my call. Outreach attempt #2.   Estelle Grumbles, PharmD, [facility] [phone]

## 2022-07-08 ENCOUNTER — Ambulatory Visit (INDEPENDENT_AMBULATORY_CARE_PROVIDER_SITE_OTHER): Payer: Medicare Other | Admitting: Family Medicine

## 2022-07-08 DIAGNOSIS — Z91199 Patient's noncompliance with other medical treatment and regimen due to unspecified reason: Secondary | ICD-10-CM

## 2022-07-08 NOTE — Progress Notes (Signed)
Patient was not seen for appt d/t no call, no show, or late arrival >10 mins past appt time.   Baila Rouse T Forbes Loll, FNP  [facility] [address] [place], [place] [address] [phone] (phone) [phone] (fax) [facility]  

## 2022-07-28 ENCOUNTER — Other Ambulatory Visit: Payer: Self-pay | Admitting: Family Medicine

## 2022-07-28 NOTE — Telephone Encounter (Signed)
Requested Prescriptions  Pending Prescriptions Disp Refills   omeprazole (PRILOSEC) 20 MG capsule [Pharmacy Med Name: OMEPRAZOLE DR 20 MG CAPSULE] 90 capsule 0    Sig: TAKE 1 CAPSULE BY MOUTH EVERY DAY     Gastroenterology: Proton Pump Inhibitors Passed - 07/28/2022  2:12 AM      Passed - Valid encounter within last 12 months    Recent Outpatient Visits           2 weeks ago No-show for appointment   [facility] Merita Norton T, FNP   6 months ago Uncontrolled diabetes mellitus with hyperglycemia, without long-term current use of insulin [facility])   [facility] Jacky Kindle, FNP   9 months ago Primary hypertension   [facility] Jacky Kindle, [place]

## 2022-08-10 ENCOUNTER — Other Ambulatory Visit: Payer: Self-pay | Admitting: Family Medicine

## 2022-08-10 DIAGNOSIS — J4520 Mild intermittent asthma, uncomplicated: Secondary | ICD-10-CM

## 2022-08-12 ENCOUNTER — Other Ambulatory Visit: Payer: Self-pay | Admitting: Family Medicine

## 2022-08-12 DIAGNOSIS — E1165 Type 2 diabetes mellitus with hyperglycemia: Secondary | ICD-10-CM

## 2023-01-26 ENCOUNTER — Telehealth: Payer: Self-pay | Admitting: Family Medicine

## 2023-01-26 ENCOUNTER — Telehealth (INDEPENDENT_AMBULATORY_CARE_PROVIDER_SITE_OTHER): Payer: Medicare PPO | Admitting: Family Medicine

## 2023-01-26 DIAGNOSIS — J4520 Mild intermittent asthma, uncomplicated: Secondary | ICD-10-CM

## 2023-01-26 DIAGNOSIS — J209 Acute bronchitis, unspecified: Secondary | ICD-10-CM

## 2023-01-26 DIAGNOSIS — F17210 Nicotine dependence, cigarettes, uncomplicated: Secondary | ICD-10-CM

## 2023-01-26 MED ORDER — PREDNISONE 20 MG PO TABS: 40.0000 mg | ORAL_TABLET | Freq: Every day | ORAL | 0 refills | Status: AC

## 2023-01-26 MED ORDER — ALBUTEROL SULFATE HFA 108 (90 BASE) MCG/ACT IN AERS
2.0000 | INHALATION_SPRAY | Freq: Four times a day (QID) | RESPIRATORY_TRACT | 1 refills | Status: DC | PRN
Start: 2023-01-26 — End: 2023-02-17

## 2023-01-26 MED ORDER — AZITHROMYCIN 250 MG PO TABS: ORAL_TABLET | ORAL | 0 refills | Status: AC

## 2023-01-26 NOTE — Progress Notes (Signed)
 MyChart Video Visit    Virtual Visit via Video Note   This format is felt to be most appropriate for this patient at this time. Physical exam was limited by quality of the video and audio technology used for the visit.    Patient location: home Provider location: [facility] Persons involved in the visit: patient, provider  I discussed the limitations of evaluation and management by telemedicine and the availability of in person appointments. The patient expressed understanding and agreed to proceed.  Patient: Nicholas Werner   DOB: Feb 21, 1950   73 y.o. Male  MRN: [id number] Visit Date: 01/26/2023  Today's healthcare provider: Jon Eva, MD   No chief complaint on file.  Subjective    HPI   Discussed the use of AI scribe software for clinical note transcription with the patient, who gave verbal consent to proceed.  History of Present Illness   The patient, a long-term smoker, presents with a two-week history of respiratory symptoms. He describes difficulty in expectorating mucus and occasional nasal congestion. He denies having headaches or fever. The severity of his symptoms has prevented him from smoking for over a week. +SOB.  He has been using an inhaler (albuterol ), which he reports has been helpful, but it has run out. He has also been taking Coricidin HP for his symptoms.       Review of Systems      Objective    There were no vitals taken for this visit.      Physical Exam Constitutional:      General: He is not in acute distress.    Appearance: Normal appearance. He is not diaphoretic.  HENT:     Head: Normocephalic.  Eyes:     Conjunctiva/sclera: Conjunctivae normal.  Pulmonary:     Effort: Pulmonary effort is normal. No respiratory distress.  Neurological:     Mental Status: He is alert and oriented to person, place, and time. Mental status is at baseline.        Assessment & Plan     Problem List Items Addressed  This Visit   None Visit Diagnoses       Mild intermittent reactive airway disease without complication    -  Primary   Relevant Medications   albuterol  (VENTOLIN  HFA) 108 (90 Base) MCG/ACT inhaler   predniSONE  (DELTASONE ) 20 MG tablet     Acute bronchitis with COPD (HCC)       Relevant Medications   albuterol  (VENTOLIN  HFA) 108 (90 Base) MCG/ACT inhaler   predniSONE  (DELTASONE ) 20 MG tablet   azithromycin  (ZITHROMAX ) 250 MG tablet           Bronchitis, possibly with underlying COPD related to long term smoking, but never diagnosed Symptoms consistent with bronchitis, including difficulty expectorating mucus, intermittent nasal congestion, and coughing. Long-term smoking likely contributes to inflammation and mucus trapping - treat like COPD exacerbation. No fever or headaches. Symptoms for two weeks. Discussed risks and benefits of albuterol  inhaler, prednisone , azithromycin , and Mucinex. Alternative treatments include other expectorants and supportive care. Anticipated improvement within a week. - Prescribe albuterol  inhaler, 2 puffs every 6 hours as needed for shortness of breath, wheezing, and coughing spells - Prescribe prednisone , 2 pills daily in the morning with food for one week - Prescribe azithromycin  (Z-Pak), 2 pills on the first day, then 1 pill daily for the next 4 days - Recommend Mucinex (plain) to help thin mucus and facilitate expectoration  General Health Maintenance Switched insurance providers  from [employer] to [employer]. Requires updates to insurance information and connection with new primary care provider, [facility]. - Update insurance information to [employer] connection with new primary care provider, [facility].       Meds ordered this encounter  Medications   albuterol  (VENTOLIN  HFA) 108 (90 Base) MCG/ACT inhaler    Sig: Inhale 2 puffs into the lungs every 6 (six) hours as needed for wheezing or shortness of breath.    Dispense:  8.5 each    Refill:   1   predniSONE  (DELTASONE ) 20 MG tablet    Sig: Take 2 tablets (40 mg total) by mouth daily with breakfast for 7 days.    Dispense:  14 tablet    Refill:  0   azithromycin  (ZITHROMAX ) 250 MG tablet    Sig: Take 500mg  PO daily x1d and then 250mg  daily x4 days    Dispense:  6 each    Refill:  0     No follow-ups on file.     I discussed the assessment and treatment plan with the patient. The patient was provided an opportunity to ask questions and all were answered. The patient agreed with the plan and demonstrated an understanding of the instructions.   The patient was advised to call back or seek an in-person evaluation if the symptoms worsen or if the condition fails to improve as anticipated.   Jon Eva, MD [facility] [phone] (phone) [phone] (fax)  [facility]

## 2023-01-26 NOTE — Telephone Encounter (Signed)
 PA submitted in Cover My Meds for Prednisone   Nicholas Werner (Key: [facility]) PA Case ID #: [id number] Rx #: [id number]

## 2023-01-26 NOTE — Telephone Encounter (Signed)
 Called patient to try and update insurance over the phone. Information provided would not work so it needs to be updated next in person visit with patient

## 2023-01-26 NOTE — Telephone Encounter (Signed)
 [facility] pharmacy is requesting prior authorization Key: [id number] predniSONE 20mG  tablets

## 2023-01-27 NOTE — Telephone Encounter (Signed)
 Notice of Denial of Medical Coverage   Date: 01/27/2023  Member number: [id number] Name: Nicholas Werner   Your request was DENIED  We've denied the Part B drug listed below requested by you or your doctor:   PREDNISONE  20 MG TABLET    Why did we deny your request?  We denied the Part B drug listed above because: You asked for a 'self-administered' drug (you take it yourself or a caregiver gives you the  drug). Your [employer] Medicare Advantage (MA) plan has Part A and Part B coverage, but  no Part D drug coverage. Medicare says Part B only covers some prescription drugs. Part  B can cover your drug if it is one of the following: blood clotting factors,  immunosuppressive drugs to prevent transplant rejection, erythropoietin for kidney  dialysis patients, certain oral anti-cancer drugs, and anti-emetics used in certain  situations. With the information we have, this drug is not covered under Part B. You can  ask your Part D drug plan. More information is in: your Evidence of Coverage booklet,  under 'Using the plan's coverage for your medical services' and ' What services are not  covered by the plan'; the Medicare Benefit Policy Manual Chapter 15, Section 50.5; and  Medicare's Local Coverage Article for 'Self-Administered Drug Exclusion List'.

## 2023-01-27 NOTE — Telephone Encounter (Signed)
 I spoke to [employer]. Cash price for the Prednisone  is $13.   I attempted to call patient. No answer. Per DPR, can leaved detailed message on VM. Given that we are closing the office soon, I left a detailed message for patient as follows: We attempted a prior authorization for your Prednisone  but your Medicare insurance denied it. I spoke to [facility] pharmacy and they advised the cost is $13. We hope this is an option for you. Please give us  a call if you need further.

## 2023-02-15 ENCOUNTER — Telehealth: Payer: Self-pay | Admitting: Family Medicine

## 2023-02-15 NOTE — Telephone Encounter (Signed)
[facility] faxed refill request for the following medications:   albuterol (VENTOLIN HFA) 108 (90 Base) MCG/ACT inhaler    Please advise.

## 2023-02-15 NOTE — Telephone Encounter (Signed)
Prescription was filled on 01/26/23 by Dr.Bacigalupo. Order class normal. With one refill available to [id number] - [place], [place] - [address]

## 2023-02-17 ENCOUNTER — Telehealth: Payer: Self-pay | Admitting: Family Medicine

## 2023-02-17 DIAGNOSIS — J4520 Mild intermittent asthma, uncomplicated: Secondary | ICD-10-CM

## 2023-02-17 MED ORDER — ALBUTEROL SULFATE HFA 108 (90 BASE) MCG/ACT IN AERS
2.0000 | INHALATION_SPRAY | Freq: Four times a day (QID) | RESPIRATORY_TRACT | 1 refills | Status: AC | PRN
Start: 2023-02-17 — End: ?

## 2023-02-17 NOTE — Telephone Encounter (Signed)
[facility] is requesting prescription refill albuterol (VENTOLIN HFA) 108 (90 Base) MCG/ACT inhaler  Please advise

## 2023-05-01 ENCOUNTER — Ambulatory Visit: Payer: Self-pay

## 2023-05-01 NOTE — Telephone Encounter (Signed)
 Copied from CRM [id number]. Topic: Clinical - Red Word Triage >> May 01, 2023  4:53 PM Donald Frost wrote: Red Word that prompted transfer to Nurse Triage: The patient called in stating he has a cyst or boil right below his shoulder blade. He states it has grown and is bothersome. He says it itches and hurts when he lays down to sleep. I will transfer him to [facility]  Chief Complaint: Skin mass Symptoms: Pain, itching Frequency: A few days Pertinent Negatives: Patient denies fever Disposition: [] ED /[x] Urgent Care (no appt availability in office) / [] Appointment(In office/virtual)/ []  [facility]/ [] Home Care/ [] Refused Recommended Disposition /[]  Mobile Bus/ []  Follow-up with PCP Additional Notes: Patient called in to report a painful skin mass below his right shoulder blade. Patient stated lump is about an inch in length. Patient stated the lump is the color of his skin. Patient stated lump is painful and itchy to the touch. Patient denied redness and fever. Advised patient to see provider within 24 hours, per protocol. No availability tomorrow at PCP office. Patient opted for UC. Patient stated he would go tonight or tomorrow morning. Provided care advice and instructed patient to call back if symptoms worsen. Patient complied.   Reason for Disposition  [1] Swelling is painful to touch AND [2] no fever  Answer Assessment - Initial Assessment Questions 1. APPEARANCE of SWELLING: "What does it look like?"     States lump is the color of his skin with a little white spot in the middle 2. SIZE: "How large is the swelling?" (e.g., inches, cm; or compare to size of pinhead, tip of pen, eraser, coin, pea, grape, ping pong ball)      About an inch 3. LOCATION: "Where is the swelling located?"     Below right shoulder blade 4. ONSET: "When did the swelling start?"     States it has grown and become more bothersome within past few days 5. COLOR: "What color is it?" "Is there more than  one color?"     Skin color 6. PAIN: "Is there any pain?" If Yes, ask: "How bad is the pain?" (e.g., scale 1-10; or mild, moderate, severe)     - NONE (0): no pain   - MILD (1-3): doesn't interfere with normal activities    - MODERATE (4-7): interferes with normal activities or awakens from sleep    - SEVERE (8-10): excruciating pain, unable to do any normal activities     Tender to the touch 7. ITCH: "Does it itch?" If Yes, ask: "How bad is the itch?"      Yes 8. CAUSE: "What do you think caused the swelling?"     Unknown 9 OTHER SYMPTOMS: "Do you have any other symptoms?" (e.g., fever)     Denies fever  Protocols used: Skin Lump or Localized Swelling-A-AH

## 2023-05-01 NOTE — Telephone Encounter (Signed)
FYI please see the message below.

## 2023-09-20 ENCOUNTER — Encounter

## 2023-11-02 ENCOUNTER — Other Ambulatory Visit: Payer: Self-pay | Admitting: Internal Medicine

## 2023-11-02 DIAGNOSIS — F17210 Nicotine dependence, cigarettes, uncomplicated: Secondary | ICD-10-CM

## 2023-12-05 ENCOUNTER — Telehealth: Payer: Self-pay | Admitting: Family Medicine

## 2023-12-05 ENCOUNTER — Other Ambulatory Visit: Payer: Self-pay

## 2023-12-05 NOTE — Telephone Encounter (Signed)
 Converted

## 2023-12-05 NOTE — Telephone Encounter (Signed)
 [facility] faxed refill request for the following medications:  azithromycin  (ZITHROMAX ) 250 MG tablet     Please advise.
